# Patient Record
Sex: Female | Born: 1970
Health system: Southern US, Community
[De-identification: ages and names within clinical notes are randomized; demographics above are authoritative.]

## PROBLEM LIST (undated history)

## (undated) DIAGNOSIS — N61 Mastitis without abscess: Secondary | ICD-10-CM

## (undated) DIAGNOSIS — I10 Essential (primary) hypertension: Secondary | ICD-10-CM

## (undated) DIAGNOSIS — E079 Disorder of thyroid, unspecified: Secondary | ICD-10-CM

## (undated) DIAGNOSIS — F99 Mental disorder, not otherwise specified: Secondary | ICD-10-CM

## (undated) DIAGNOSIS — Z309 Encounter for contraceptive management, unspecified: Secondary | ICD-10-CM

## (undated) DIAGNOSIS — N6320 Unspecified lump in the left breast, unspecified quadrant: Principal | ICD-10-CM

## (undated) DIAGNOSIS — R87629 Unspecified abnormal cytological findings in specimens from vagina: Secondary | ICD-10-CM

## (undated) DIAGNOSIS — R112 Nausea with vomiting, unspecified: Secondary | ICD-10-CM

## (undated) DIAGNOSIS — Z9889 Other specified postprocedural states: Secondary | ICD-10-CM

## (undated) HISTORY — DX: Disorder of thyroid, unspecified: E07.9

## (undated) HISTORY — DX: Mastitis without abscess: N61.0

## (undated) HISTORY — DX: Unspecified abnormal cytological findings in specimens from vagina: R87.629

## (undated) HISTORY — DX: Mental disorder, not otherwise specified: F99

## (undated) HISTORY — DX: Unspecified lump in the left breast, unspecified quadrant: N63.20

## (undated) HISTORY — DX: Essential (primary) hypertension: I10

## (undated) HISTORY — PX: CHOLECYSTECTOMY: SHX55

## (undated) HISTORY — DX: Encounter for contraceptive management, unspecified: Z30.9

---

## 2001-06-02 ENCOUNTER — Encounter: Payer: Self-pay | Admitting: Obstetrics and Gynecology

## 2001-06-02 ENCOUNTER — Ambulatory Visit (HOSPITAL_COMMUNITY): Admission: RE | Admit: 2001-06-02 | Discharge: 2001-06-02 | Payer: Self-pay | Admitting: Obstetrics and Gynecology

## 2001-06-19 ENCOUNTER — Ambulatory Visit (HOSPITAL_COMMUNITY): Admission: RE | Admit: 2001-06-19 | Discharge: 2001-06-19 | Payer: Self-pay | Admitting: Obstetrics and Gynecology

## 2001-07-02 ENCOUNTER — Ambulatory Visit (HOSPITAL_COMMUNITY): Admission: RE | Admit: 2001-07-02 | Discharge: 2001-07-02 | Payer: Self-pay | Admitting: Obstetrics and Gynecology

## 2001-07-06 ENCOUNTER — Inpatient Hospital Stay (HOSPITAL_COMMUNITY): Admission: RE | Admit: 2001-07-06 | Discharge: 2001-07-07 | Payer: Self-pay | Admitting: Obstetrics and Gynecology

## 2001-09-29 ENCOUNTER — Other Ambulatory Visit: Admission: RE | Admit: 2001-09-29 | Discharge: 2001-09-29 | Payer: Self-pay | Admitting: Obstetrics and Gynecology

## 2004-10-31 ENCOUNTER — Ambulatory Visit (HOSPITAL_COMMUNITY): Admission: RE | Admit: 2004-10-31 | Discharge: 2004-10-31 | Payer: Self-pay | Admitting: Obstetrics and Gynecology

## 2004-11-07 ENCOUNTER — Ambulatory Visit (HOSPITAL_COMMUNITY): Admission: RE | Admit: 2004-11-07 | Discharge: 2004-11-07 | Payer: Self-pay | Admitting: Obstetrics and Gynecology

## 2006-03-19 ENCOUNTER — Ambulatory Visit (HOSPITAL_COMMUNITY): Admission: RE | Admit: 2006-03-19 | Discharge: 2006-03-19 | Payer: Self-pay | Admitting: Obstetrics and Gynecology

## 2007-04-01 ENCOUNTER — Ambulatory Visit (HOSPITAL_COMMUNITY): Admission: RE | Admit: 2007-04-01 | Discharge: 2007-04-01 | Payer: Self-pay | Admitting: Obstetrics and Gynecology

## 2008-05-24 ENCOUNTER — Other Ambulatory Visit: Admission: RE | Admit: 2008-05-24 | Discharge: 2008-05-24 | Payer: Self-pay | Admitting: Obstetrics and Gynecology

## 2009-06-16 ENCOUNTER — Other Ambulatory Visit: Admission: RE | Admit: 2009-06-16 | Discharge: 2009-06-16 | Payer: Self-pay | Admitting: Obstetrics and Gynecology

## 2010-10-15 ENCOUNTER — Other Ambulatory Visit (HOSPITAL_COMMUNITY)
Admission: RE | Admit: 2010-10-15 | Discharge: 2010-10-15 | Disposition: A | Discharge status: Home / Self Care | Payer: Commercial Indemnity | Source: Ambulatory Visit | Attending: Obstetrics and Gynecology | Admitting: Obstetrics and Gynecology

## 2010-10-15 DIAGNOSIS — Z01419 Encounter for gynecological examination (general) (routine) without abnormal findings: Secondary | ICD-10-CM | POA: Insufficient documentation

## 2010-12-21 NOTE — Procedures (Signed)
[facility]  Patient:    Sandra Cortez, Sandra Cortez Visit Number: [id number] MRN: [id number]          Service Type: OBS Location: 4A A414 01 Attending Physician:  Tilda Burrow Dictated by:   Kari Baars, M.D. Proc. Date: 06/19/01 Admit Date:  06/19/2001 Discharge Date: 06/19/2001                            EKG Interpretations  TIME:  1107  INTERPRETATION:  The rhythm is sinus arrhythmia and there are areas that look like atrial fibrillation but I believe the rhythm is sinus rhythm with sinus arrhythmia.  There are PVCs.  Possible left atrial enlargement is seen.  T wave abnormalities are seen inferiorly which are nonspecific.  IMPRESSION:  Abnormal electrocardiogram. Dictated by:   Kari Baars, M.D. Attending Physician:  Tilda Burrow DD:  06/20/01 TD:  06/20/01 Job: [id number]

## 2010-12-21 NOTE — Op Note (Signed)
[facility]  Patient:    Sandra Cortez, Sandra Cortez Visit Number: [id number] MRN: [id number]          Service Type: OBS Location: 4A A418 01 Attending Physician:  Tilda Burrow Dictated by:   Christin Bach, M.D. Proc. Date: 07/06/01 Admit Date:  07/06/2001                             Operative Report  TIME:  9:50 a.m.  PROCEDURE:  Epidural catheter placement.  OBSTETRICIAN:  Christin Bach, M.D.  DETAILS OF PROCEDURE:  After IV fluid bolus and 10 mg of Nubain did not help with the patients relief, epidural catheter was placed using loss-of-resistance technique on first attempt at L3-4 interspace with easy identification of epidural space, placement of epidural catheter with 5 cc bolus of 1.5% Xylocaine with epinephrine, followed by 5 cc of Marcaine 0.125% solution.  The patient had gradual improved analgesia and at 10:05, was noted to be rim dilation.  The catheter will be continued through labor, with prompt vaginal delivery anticipated. Dictated by:   Christin Bach, M.D. Attending Physician:  Tilda Burrow DD:  07/06/01 TD:  07/06/01 Job: [id number]

## 2010-12-21 NOTE — Op Note (Signed)
[facility]  Patient:    Sandra Cortez, Sandra Cortez Visit Number: [id number] MRN: [id number]          Service Type: OBS Location: 4A A418 01 Attending Physician:  Tilda Burrow Dictated by:   Christin Bach, M.D. Admit Date:  07/06/2001   CC:         Vivia Ewing, D.O.   Operative Report  DELIVERY TIME:  Time, 1145 a.m.  SURGEON: Christin Bach, M.D.  DESCRIPTION OF PROCEDURE: The patient progressed to completely dilated, with excellent analgesic relief from the epidural.  The epidural was stopped so she could have the perception of pressure.  She pushed through a second stage of approximately 45 minutes.  As she began to crown there was significant fetal bradycardia due to head compression, which was treated with oxygen supplementation.  The patient progressed to spontaneous vertex vaginal delivery from a direct OA position of a healthy female infant, with Apgars of 8 and 9, delivered without difficulty.  The right shoulder and arm were delivered first and the rest of the fetal body all delivered over an intact perineum.  The infant weight was ______.  Apgars were 9 and 9 assigned.  The placenta delivered easily, Schultze presentation, after blood gas was obtained.  There were no lacerations.  The placenta delivered easily  EBL 350 cc.  The patient had epidural catheter removed with the tip visualized as intact.  Delivery was attended by the patients sister and husband and mother. Dictated by:   Christin Bach, M.D. Attending Physician:  Tilda Burrow DD:  07/06/01 TD:  07/06/01 Job: [id number]

## 2010-12-21 NOTE — H&P (Signed)
[facility]  Patient:    Sandra Cortez, Sandra Cortez Visit Number: [id number] MRN: [id number]          Service Type: OBS Location: 4A A418 01 Attending Physician:  Tilda Burrow Dictated by:   Christin Bach, M.D. Admit Date:  07/06/2001   CC:         Vivia Ewing, D.O.   History and Physical  ADMITTING DIAGNOSIS:  Pregnancy at 39-1/[redacted] weeks gestation, active-phase labor.  HISTORY OF PRESENT ILLNESS:  This 40 year old female, gravida 2, para 1, Ab0, LMP October 05, 2000, placing menstrual [facility] December 9th, with corresponding ultrasound EDCs of July 19, 2001, then July 11, 2001, is admitted after a pregnancy course followed through our office.  The patient has been seen twice in the past week for labor symptoms, being sent home on Thanksgiving Day for false labor.  She returns this a.m. with regular contraction pattern, cervical dilation to 3 cm upon admission, progressing to 5 cm by 9 a.m., with membrane rupture revealing scanty amounts of amniotic fluid.  Fetal heart rate tracing is reactive.  The patient is admitted for labor management and expected vaginal delivery.  Patient has requested epidural analgesia.  Plan is to breast feed and will use birth control pills after she discontinues breast feeding.  Prenatal labs include blood type A-negative with RhoGAM administered May 19, 2001; hemoglobin 13; hematocrit 37; hepatitis, HIV, GC, Chlamydia, RPR all negative; glucose challenge test normal at 134 mg%.  Patient is admitted with rapid progress to vaginal delivery anticipated.  PAST MEDICAL HISTORY:  Abnormal Pap, 2000, with negative followup. Cholecystectomy, 2001.  ALLERGIES:  None.  SOCIAL HISTORY:  Cigarettes, alcohol and recreational drugs denied.  PHYSICAL EXAMINATION:  VITAL SIGNS:  Height 5 feet 6 inches.  Weight 169 pounds.  Blood pressure 130/80.  ABDOMEN:  Fundal height 38 cm.  PELVIC:  Cervix 5 cm, 100% effaced, 0 station by  my initial exam at 9 a.m.  PLAN:  Epidural analgesia and probable vaginal delivery. Dictated by:   Christin Bach, M.D. Attending Physician:  Tilda Burrow DD:  07/06/01 TD:  07/06/01 Job: [id number]

## 2010-12-21 NOTE — Procedures (Signed)
[facility]  Patient:    BENNETT, VANSCYOC Visit Number: [id number] MRN: [id number]          Service Type: OBS Location: 4A A418 01 Attending Physician:  Tilda Burrow Dictated by:   Kari Baars, M.D. Admit Date:  07/06/2001 Discharge Date: 07/07/2001                            EKG Interpretations  INTERPRETATION:  The rhythm is a sinus rhythm with sinus arrhythmia and PVCs. There is probable left atrial enlargement.  T wave abnormalities are seen inferiorly which are nonspecific.  IMPRESSION:  Abnormal electrocardiogram. Dictated by:   Kari Baars, M.D. Attending Physician:  Tilda Burrow DD:  07/14/01 TD:  07/15/01 Job: [id number]

## 2011-07-11 ENCOUNTER — Other Ambulatory Visit: Payer: Self-pay | Admitting: Adult Health

## 2011-07-11 DIAGNOSIS — N63 Unspecified lump in unspecified breast: Secondary | ICD-10-CM

## 2011-07-31 ENCOUNTER — Other Ambulatory Visit (HOSPITAL_COMMUNITY): Payer: Self-pay | Admitting: Adult Health

## 2011-07-31 ENCOUNTER — Ambulatory Visit (HOSPITAL_COMMUNITY)
Admission: RE | Admit: 2011-07-31 | Discharge: 2011-07-31 | Disposition: A | Discharge status: Home / Self Care | Payer: Commercial Indemnity | Source: Ambulatory Visit | Attending: Adult Health | Admitting: Adult Health

## 2011-07-31 DIAGNOSIS — N63 Unspecified lump in unspecified breast: Secondary | ICD-10-CM | POA: Insufficient documentation

## 2011-07-31 DIAGNOSIS — N6009 Solitary cyst of unspecified breast: Secondary | ICD-10-CM | POA: Insufficient documentation

## 2011-08-01 ENCOUNTER — Encounter: Payer: Self-pay | Admitting: Gastroenterology

## 2011-08-08 ENCOUNTER — Ambulatory Visit: Payer: Commercial Indemnity | Admitting: Urgent Care

## 2011-08-14 ENCOUNTER — Ambulatory Visit: Payer: Commercial Indemnity | Admitting: Gastroenterology

## 2011-08-26 ENCOUNTER — Ambulatory Visit: Payer: Commercial Indemnity | Admitting: Gastroenterology

## 2011-09-10 ENCOUNTER — Ambulatory Visit: Payer: Commercial Indemnity | Admitting: Gastroenterology

## 2011-10-21 ENCOUNTER — Other Ambulatory Visit (HOSPITAL_COMMUNITY)
Admission: RE | Admit: 2011-10-21 | Discharge: 2011-10-21 | Disposition: A | Discharge status: Home / Self Care | Payer: Commercial Indemnity | Source: Ambulatory Visit | Attending: Obstetrics and Gynecology | Admitting: Obstetrics and Gynecology

## 2011-10-21 DIAGNOSIS — Z01419 Encounter for gynecological examination (general) (routine) without abnormal findings: Secondary | ICD-10-CM | POA: Insufficient documentation

## 2012-07-10 ENCOUNTER — Other Ambulatory Visit: Payer: Self-pay | Admitting: Adult Health

## 2012-07-10 DIAGNOSIS — Z09 Encounter for follow-up examination after completed treatment for conditions other than malignant neoplasm: Secondary | ICD-10-CM

## 2012-08-14 ENCOUNTER — Ambulatory Visit (HOSPITAL_COMMUNITY)
Admission: RE | Admit: 2012-08-14 | Discharge: 2012-08-14 | Disposition: A | Discharge status: Home / Self Care | Payer: Commercial Indemnity | Source: Ambulatory Visit | Attending: Adult Health | Admitting: Adult Health

## 2012-08-14 DIAGNOSIS — Z1231 Encounter for screening mammogram for malignant neoplasm of breast: Secondary | ICD-10-CM | POA: Insufficient documentation

## 2012-08-14 DIAGNOSIS — Z09 Encounter for follow-up examination after completed treatment for conditions other than malignant neoplasm: Secondary | ICD-10-CM

## 2012-08-20 ENCOUNTER — Other Ambulatory Visit: Payer: Self-pay | Admitting: Adult Health

## 2012-08-20 DIAGNOSIS — R928 Other abnormal and inconclusive findings on diagnostic imaging of breast: Secondary | ICD-10-CM

## 2012-09-02 ENCOUNTER — Encounter (HOSPITAL_COMMUNITY): Payer: Commercial Indemnity

## 2012-09-23 ENCOUNTER — Ambulatory Visit (HOSPITAL_COMMUNITY)
Admission: RE | Admit: 2012-09-23 | Discharge: 2012-09-23 | Disposition: A | Discharge status: Home / Self Care | Payer: Commercial Indemnity | Source: Ambulatory Visit | Attending: Adult Health | Admitting: Adult Health

## 2012-09-23 ENCOUNTER — Other Ambulatory Visit (HOSPITAL_COMMUNITY): Payer: Self-pay | Admitting: Adult Health

## 2012-09-23 DIAGNOSIS — R928 Other abnormal and inconclusive findings on diagnostic imaging of breast: Secondary | ICD-10-CM | POA: Insufficient documentation

## 2012-09-23 DIAGNOSIS — N6009 Solitary cyst of unspecified breast: Secondary | ICD-10-CM | POA: Insufficient documentation

## 2013-10-11 ENCOUNTER — Other Ambulatory Visit: Payer: Self-pay | Admitting: Obstetrics and Gynecology

## 2013-10-11 DIAGNOSIS — Z1231 Encounter for screening mammogram for malignant neoplasm of breast: Secondary | ICD-10-CM

## 2013-11-02 ENCOUNTER — Ambulatory Visit (HOSPITAL_COMMUNITY)
Admission: RE | Admit: 2013-11-02 | Discharge: 2013-11-02 | Disposition: A | Discharge status: Home / Self Care | Payer: BC Managed Care – PPO | Source: Ambulatory Visit | Attending: Obstetrics and Gynecology | Admitting: Obstetrics and Gynecology

## 2013-11-02 DIAGNOSIS — Z1231 Encounter for screening mammogram for malignant neoplasm of breast: Secondary | ICD-10-CM | POA: Insufficient documentation

## 2013-11-10 ENCOUNTER — Other Ambulatory Visit: Payer: Self-pay | Admitting: Adult Health

## 2013-12-07 ENCOUNTER — Other Ambulatory Visit (HOSPITAL_COMMUNITY)
Admission: RE | Admit: 2013-12-07 | Discharge: 2013-12-07 | Disposition: A | Discharge status: Home / Self Care | Payer: BC Managed Care – PPO | Source: Ambulatory Visit | Attending: Adult Health | Admitting: Adult Health

## 2013-12-07 ENCOUNTER — Encounter: Payer: Self-pay | Admitting: Adult Health

## 2013-12-07 ENCOUNTER — Ambulatory Visit (INDEPENDENT_AMBULATORY_CARE_PROVIDER_SITE_OTHER): Payer: BC Managed Care – PPO | Admitting: Adult Health

## 2013-12-07 VITALS — BP 122/80 | HR 74 | Ht 65.0 in | Wt 145.0 lb

## 2013-12-07 DIAGNOSIS — Z1212 Encounter for screening for malignant neoplasm of rectum: Secondary | ICD-10-CM

## 2013-12-07 DIAGNOSIS — Z01419 Encounter for gynecological examination (general) (routine) without abnormal findings: Secondary | ICD-10-CM

## 2013-12-07 DIAGNOSIS — Z1151 Encounter for screening for human papillomavirus (HPV): Secondary | ICD-10-CM | POA: Insufficient documentation

## 2013-12-07 LAB — COMPREHENSIVE METABOLIC PANEL
ALBUMIN: 4.3 g/dL (ref 3.5–5.2)
ALK PHOS: 50 U/L (ref 39–117)
ALT: 9 U/L (ref 0–35)
AST: 13 U/L (ref 0–37)
BUN: 9 mg/dL (ref 6–23)
CHLORIDE: 105 meq/L (ref 96–112)
CO2: 28 mEq/L (ref 19–32)
Calcium: 9.1 mg/dL (ref 8.4–10.5)
Creat: 0.67 mg/dL (ref 0.50–1.10)
Glucose, Bld: 91 mg/dL (ref 70–99)
POTASSIUM: 4 meq/L (ref 3.5–5.3)
SODIUM: 138 meq/L (ref 135–145)
TOTAL PROTEIN: 6.7 g/dL (ref 6.0–8.3)
Total Bilirubin: 0.6 mg/dL (ref 0.2–1.2)

## 2013-12-07 LAB — HEMOCCULT GUIAC POC 1CARD (OFFICE): FECAL OCCULT BLD: NEGATIVE

## 2013-12-07 LAB — CBC
HCT: 36.8 % (ref 36.0–46.0)
Hemoglobin: 12.5 g/dL (ref 12.0–15.0)
MCH: 28.3 pg (ref 26.0–34.0)
MCHC: 34 g/dL (ref 30.0–36.0)
MCV: 83.4 fL (ref 78.0–100.0)
Platelets: 180 10*3/uL (ref 150–400)
RBC: 4.41 MIL/uL (ref 3.87–5.11)
RDW: 13.9 % (ref 11.5–15.5)
WBC: 4.7 10*3/uL (ref 4.0–10.5)

## 2013-12-07 LAB — LIPID PANEL
Cholesterol: 135 mg/dL (ref 0–200)
HDL: 51 mg/dL (ref >39)
LDL Cholesterol: 71 mg/dL (ref 0–99)
TRIGLYCERIDES: 63 mg/dL (ref <150)
Total CHOL/HDL Ratio: 2.6 Ratio
VLDL: 13 mg/dL (ref 0–40)

## 2013-12-07 LAB — TSH: TSH: 2.5 u[IU]/mL (ref 0.350–4.500)

## 2013-12-07 NOTE — Progress Notes (Signed)
Patient ID: SELETA HOVLAND, female   DOB: [birth year], 43 y.o.   MRN: [id number] History of Present Illness: Sharie is a 43 year old white female in for a pap and physical.No complaints.   Current Medications, Allergies, Past Medical History, Past Surgical History, Family History and Social History were reviewed in [employer] record.     Review of Systems: Patient denies any headaches, blurred vision, shortness of breath, chest pain, abdominal pain, problems with bowel movements, urination, or intercourse. Not having sex,no joint pain or mood swings.Periods are still regular and not having any hot flashes.    Physical Exam:BP 122/80  Pulse 74  Ht 5\' 5"  (1.651 m)  Wt 145 lb (65.772 kg)  BMI 24.13 kg/m2  LMP 11/17/2013 General:  Well developed, well nourished, no acute distress Skin:  Warm and dry Neck:  Midline trachea, normal thyroid Lungs; Clear to auscultation bilaterally Breast:  No dominant palpable mass, retraction, or nipple discharge Cardiovascular: Regular rate and rhythm Abdomen:  Soft, non tender, no hepatosplenomegaly Pelvic:  External genitalia is normal in appearance.  The vagina is normal in appearance. The cervix is bulbous.Pap with HPV performed.  Uterus is felt to be normal size, shape, and contour.  No                adnexal masses or tenderness noted. Rectal: Good sphincter tone, no polyps, or hemorrhoids felt.  Hemoccult negative. Extremities:  No swelling or varicosities noted Psych:  No mood changes,alert and cooperative,seems truly happy   Impression: Yearly gyn exam    Plan: Physical in 1 year Check CBC,CMP,TSH and lipids, will talk when back Mammogram yearly

## 2013-12-07 NOTE — Patient Instructions (Signed)
Physical in  1 year Mammogram yearly Will talk labs when they are back

## 2013-12-08 ENCOUNTER — Telehealth: Payer: Self-pay | Admitting: Adult Health

## 2013-12-08 NOTE — Telephone Encounter (Signed)
Pt aware labs great will mail her a copy

## 2013-12-22 ENCOUNTER — Telehealth: Payer: Self-pay | Admitting: Adult Health

## 2013-12-22 MED ORDER — NORETHIN-ETH ESTRAD-FE BIPHAS 1 MG-10 MCG / 10 MCG PO TABS: 1.0000 | ORAL_TABLET | Freq: Every day | ORAL | Status: DC

## 2013-12-22 NOTE — Telephone Encounter (Signed)
Pt states saw Derrek Monaco, NP on 12/07/2013 for annual and now requesting birth control pills.

## 2013-12-22 NOTE — Telephone Encounter (Signed)
Wants to get on OCs, will rx lo loestrin use condoms

## 2014-04-01 ENCOUNTER — Telehealth: Payer: Self-pay | Admitting: Obstetrics and Gynecology

## 2014-04-01 ENCOUNTER — Ambulatory Visit (INDEPENDENT_AMBULATORY_CARE_PROVIDER_SITE_OTHER): Payer: BC Managed Care – PPO | Admitting: Adult Health

## 2014-04-01 ENCOUNTER — Encounter: Payer: Self-pay | Admitting: Adult Health

## 2014-04-01 VITALS — BP 130/82 | Temp 99.2°F | Ht 65.0 in | Wt 146.0 lb

## 2014-04-01 DIAGNOSIS — N61 Mastitis without abscess: Secondary | ICD-10-CM

## 2014-04-01 HISTORY — DX: Mastitis without abscess: N61.0

## 2014-04-01 MED ORDER — SULFAMETHOXAZOLE-TMP DS 800-160 MG PO TABS
1.0000 | ORAL_TABLET | Freq: Two times a day (BID) | ORAL | Status: DC
Start: 2014-04-01 — End: 2016-12-26

## 2014-04-01 MED ORDER — HYDROCODONE-ACETAMINOPHEN 5-325 MG PO TABS
1.0000 | ORAL_TABLET | Freq: Four times a day (QID) | ORAL | Status: DC | PRN
Start: 2014-04-01 — End: 2016-12-26

## 2014-04-01 NOTE — Patient Instructions (Signed)
Cellulitis Cellulitis is an infection of the skin and the tissue beneath it. The infected area is usually red and tender. Cellulitis occurs most often in the arms and lower legs.  CAUSES  Cellulitis is caused by bacteria that enter the skin through cracks or cuts in the skin. The most common types of bacteria that cause cellulitis are staphylococci and streptococci. SIGNS AND SYMPTOMS   Redness and warmth.  Swelling.  Tenderness or pain.  Fever. DIAGNOSIS  Your health care provider can usually determine what is wrong based on a physical exam. Blood tests may also be done. TREATMENT  Treatment usually involves taking an antibiotic medicine. HOME CARE INSTRUCTIONS   Take your antibiotic medicine as directed by your health care provider. Finish the antibiotic even if you start to feel better.  Keep the infected arm or leg elevated to reduce swelling.  Apply a warm cloth to the affected area up to 4 times per day to relieve pain.  Take medicines only as directed by your health care provider.  Keep all follow-up visits as directed by your health care provider. SEEK MEDICAL CARE IF:   You notice red streaks coming from the infected area.  Your red area gets larger or turns dark in color.  Your bone or joint underneath the infected area becomes painful after the skin has healed.  Your infection returns in the same area or another area.  You notice a swollen bump in the infected area.  You develop new symptoms.  You have a fever. SEEK IMMEDIATE MEDICAL CARE IF:   You feel very sleepy.  You develop vomiting or diarrhea.  You have a general ill feeling (malaise) with muscle aches and pains. MAKE SURE YOU:   Understand these instructions.  Will watch your condition.  Will get help right away if you are not doing well or get worse. Document Released: 05/01/2005 Document Revised: 12/06/2013 Document Reviewed: 10/07/2011 [facility] Patient Information 2015 [place], [place].  This information is not intended to replace advice given to you by your health care provider. Make sure you discuss any questions you have with your health care provider. Take septra ds Use frozen peas on breast 10 minutes on and then off 30 min Follow up in 2 weeks If gets worse call or go To ER

## 2014-04-01 NOTE — Progress Notes (Signed)
Subjective:     Patient ID: Sandra Cortez, female   DOB: January 08, 1971, 43 y.o.   MRN: [id number]  HPI Sandra Cortez is a 43 year old white female in complaining of pain and swelling in left breast,she had pain yesterday and used heating pad last night and got up this am with redness, pain an swelling.  Review of Systems See HPI Reviewed past medical,surgical, social and family history. Reviewed medications and allergies.     Objective:   Physical Exam BP 130/82  Temp(Src) 99.2 F (37.3 C)  Ht 5\' 5"  (1.651 m)  Wt 146 lb (66.225 kg)  BMI 24.30 kg/m2  LMP 03/31/2014  Skin warm and dry,  Breasts:no dominate palpable mass, retraction or nipple discharge on right, on left no retraction or nipple discharge the whole breast is swollen and tender and is red, with heat, Dr Elonda Husky in for co exam.    Assessment:     Cellulitis left breast    Plan:    keep it supported, may want to sleep in recliner Rx septra ds 1 bid x 14 days #28 no refills Use frozen peas for 10 minutes on 30 minutes off prn Follow up in 2 weeks Go to ER or call if its worse, with fever or pain Rx norco 5-325 mg 1 every 6 hours prn pain no refills,can take 2 if 1 does not help Review handout on cellulitis

## 2014-04-04 NOTE — Telephone Encounter (Signed)
Left messages x 3 with no return call.

## 2014-04-15 ENCOUNTER — Ambulatory Visit (INDEPENDENT_AMBULATORY_CARE_PROVIDER_SITE_OTHER): Payer: BC Managed Care – PPO | Admitting: Adult Health

## 2014-04-15 ENCOUNTER — Encounter: Payer: Self-pay | Admitting: Adult Health

## 2014-04-15 VITALS — BP 138/84 | Ht 65.0 in | Wt 144.0 lb

## 2014-04-15 DIAGNOSIS — N6320 Unspecified lump in the left breast, unspecified quadrant: Secondary | ICD-10-CM | POA: Insufficient documentation

## 2014-04-15 DIAGNOSIS — N63 Unspecified lump in unspecified breast: Secondary | ICD-10-CM

## 2014-04-15 HISTORY — DX: Unspecified lump in the left breast, unspecified quadrant: N63.20

## 2014-04-15 NOTE — Progress Notes (Signed)
Subjective:     Patient ID: Sandra Cortez, female   DOB: 04/23/1971, 43 y.o.   MRN: [id number]  HPI Sandra Cortez is a 43 year old white female back for breast check has had cellulitis, still on antibiotics, is better.  Review of Systems See HPI Reviewed past medical,surgical, social and family history. Reviewed medications and allergies.     Objective:   Physical Exam BP 138/84  Ht 5\' 5"  (1.651 m)  Wt 144 lb (65.318 kg)  BMI 23.96 kg/m2  LMP 03/31/2014    Skin warm and dry,  Breasts:no dominate palpable mass, retraction or nipple discharge on right , on left no retraction or nipple discharge but has 10 x 10 cm  Mass under nipple, resolved cellulitis, no redness or heat or swelling or pain.will get left diagnostic mammogram and [place].  Assessment:     Left breast mass Resolved cellulitis    Plan:     Left diagnostic mammogram and [place] 9/22 at 2 pm at [facility] Follow up in 4 weeks of needed

## 2014-04-15 NOTE — Patient Instructions (Signed)
Follow up in 4 weeks mammogram and [place] at [facility] scheduled 9/22 at  2pm

## 2014-04-26 ENCOUNTER — Ambulatory Visit (HOSPITAL_COMMUNITY)
Admission: RE | Admit: 2014-04-26 | Discharge: 2014-04-26 | Disposition: A | Discharge status: Home / Self Care | Payer: BC Managed Care – PPO | Source: Ambulatory Visit | Attending: Adult Health | Admitting: Adult Health

## 2014-04-26 DIAGNOSIS — N63 Unspecified lump in unspecified breast: Secondary | ICD-10-CM | POA: Insufficient documentation

## 2014-04-26 DIAGNOSIS — N6320 Unspecified lump in the left breast, unspecified quadrant: Secondary | ICD-10-CM

## 2014-05-13 ENCOUNTER — Ambulatory Visit: Payer: BC Managed Care – PPO | Admitting: Adult Health

## 2014-06-06 ENCOUNTER — Encounter: Payer: Self-pay | Admitting: Adult Health

## 2014-10-21 ENCOUNTER — Other Ambulatory Visit: Payer: Self-pay | Admitting: Physician Assistant

## 2014-11-16 ENCOUNTER — Other Ambulatory Visit: Payer: Self-pay | Admitting: Adult Health

## 2014-12-16 ENCOUNTER — Ambulatory Visit (INDEPENDENT_AMBULATORY_CARE_PROVIDER_SITE_OTHER): Payer: BLUE CROSS/BLUE SHIELD | Admitting: Adult Health

## 2014-12-16 ENCOUNTER — Encounter: Payer: Self-pay | Admitting: Adult Health

## 2014-12-16 VITALS — BP 130/90 | HR 64 | Ht 65.0 in | Wt 159.2 lb

## 2014-12-16 DIAGNOSIS — Z9189 Other specified personal risk factors, not elsewhere classified: Secondary | ICD-10-CM

## 2014-12-16 DIAGNOSIS — Z01419 Encounter for gynecological examination (general) (routine) without abnormal findings: Secondary | ICD-10-CM | POA: Diagnosis not present

## 2014-12-16 DIAGNOSIS — Z1212 Encounter for screening for malignant neoplasm of rectum: Secondary | ICD-10-CM

## 2014-12-16 DIAGNOSIS — Z309 Encounter for contraceptive management, unspecified: Secondary | ICD-10-CM

## 2014-12-16 DIAGNOSIS — Z3041 Encounter for surveillance of contraceptive pills: Secondary | ICD-10-CM

## 2014-12-16 HISTORY — DX: Encounter for contraceptive management, unspecified: Z30.9

## 2014-12-16 LAB — HEMOCCULT GUIAC POC 1CARD (OFFICE): FECAL OCCULT BLD: NEGATIVE

## 2014-12-16 MED ORDER — NORETHIN-ETH ESTRAD-FE BIPHAS 1 MG-10 MCG / 10 MCG PO TABS: 1.0000 | ORAL_TABLET | Freq: Every day | ORAL | Status: DC

## 2014-12-16 NOTE — Patient Instructions (Signed)
Physical in 1 year Mammogram yearly  

## 2014-12-16 NOTE — Progress Notes (Signed)
Patient ID: Sandra Cortez, female   DOB: 27-Aug-1970, 44 y.o.   MRN: [id number] History of Present Illness: Sandra Cortez is a 44 year old white female, recently married(08/25/14), in for well woman gyn exam.She had pap 12/07/13 that was normal with negative HPV.   Current Medications, Allergies, Past Medical History, Past Surgical History, Family History and Social History were reviewed in [employer] record.     Review of Systems: Patient denies any headaches, hearing loss, fatigue, blurred vision, shortness of breath, chest pain, abdominal pain, problems with bowel movements, urination, or intercourse. No joint pain or mood swings.Happpy with OCs no period, has some pain and swelling in fingers and hands, she uses computer all day, discussed could be CTS, had negative Phalen's test and negative Tinel sign, but try cock up splits and take false nails off to see if helps.Was started on BP meds this year.    Physical Exam:BP 130/90 mmHg  Pulse 64  Ht 5\' 5"  (1.651 m)  Wt 159 lb 4 oz (72.235 kg)  BMI 26.50 kg/m2 General:  Well developed, well nourished, no acute distress Skin:  Warm and dry Neck:  Midline trachea, normal thyroid, good ROM, no lymphadenopathy Lungs; Clear to auscultation bilaterally Breast:  No dominant palpable mass, retraction, or nipple discharge Cardiovascular: Regular rate and rhythm Abdomen:  Soft, non tender, no hepatosplenomegaly Pelvic:  External genitalia is normal in appearance, no lesions.  The vagina is normal in appearance. Urethra has no lesions or masses. The cervix is bulbous.  Uterus is felt to be normal size, shape, and contour.  No adnexal masses or tenderness noted.Bladder is non tender, no masses felt. Rectal: Good sphincter tone, no polyps, or hemorrhoids felt.  Hemoccult negative. Extremities/musculoskeletal:  No swelling or varicosities noted, no clubbing or cyanosis, negative Phalen's and Tinel today Psych:  No mood changes, alert and  cooperative,seems happy   Impression: Well woman gyn exam no pap Contraceptive management    Plan: Refilled lo loestrin x 1 year Physical in 1 year Mammogram yearly

## 2014-12-24 IMAGING — MG MM DIGITAL SCREENING BILAT
4 series · 4 of 4 positions shown · non-contrast
Comparison: 07/31/2011, 04/01/2007

CLINICAL DATA: Screening.

BILATERAL DIGITAL SCREENING MAMMOGRAM WITH CAD

[L CC]
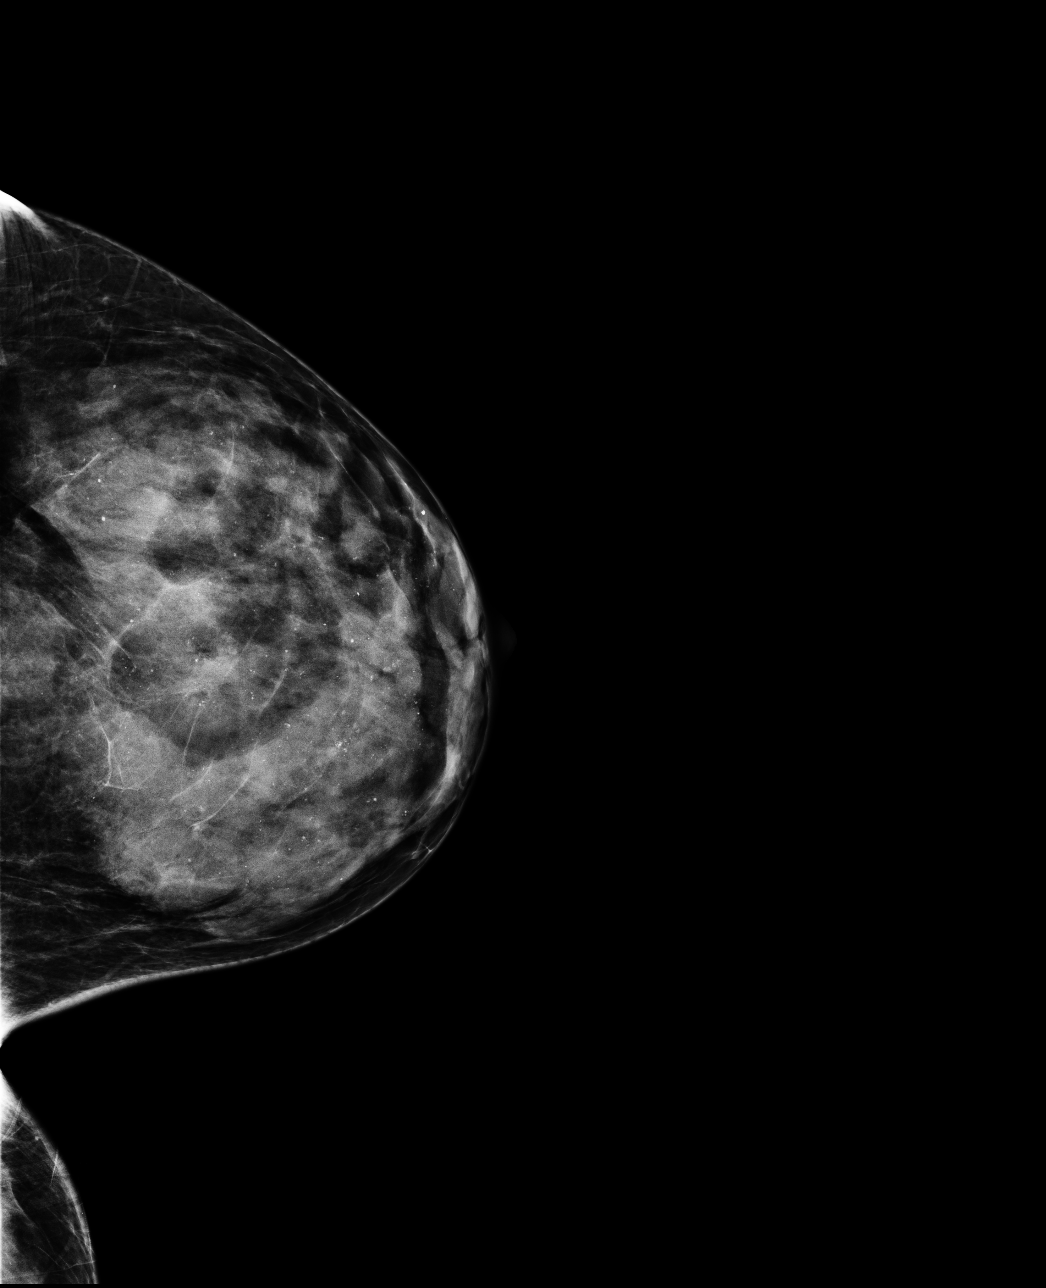

[L MLO]
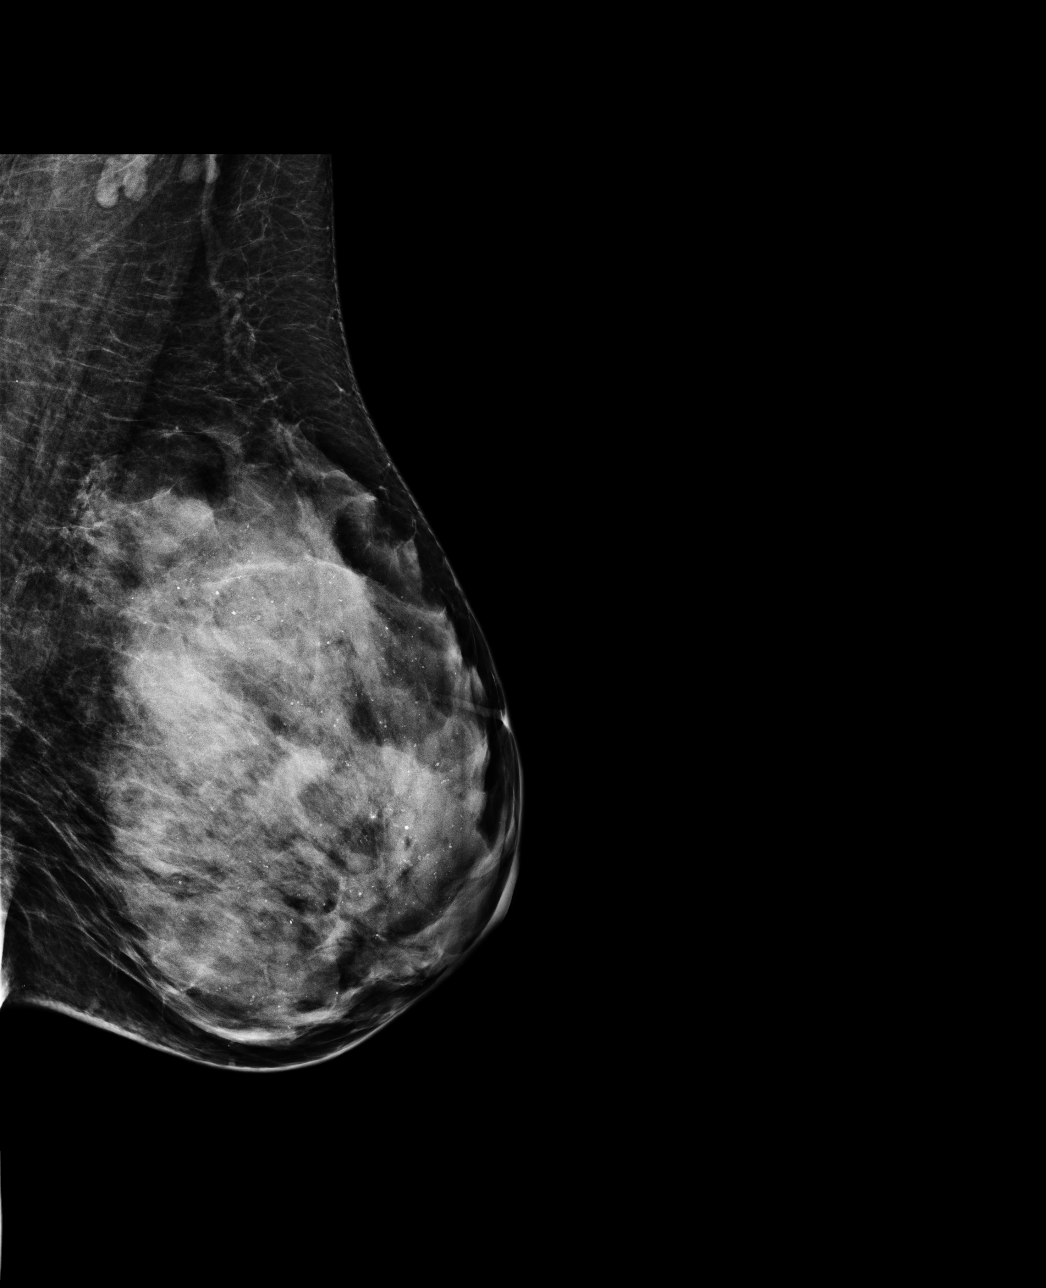

[R CC]
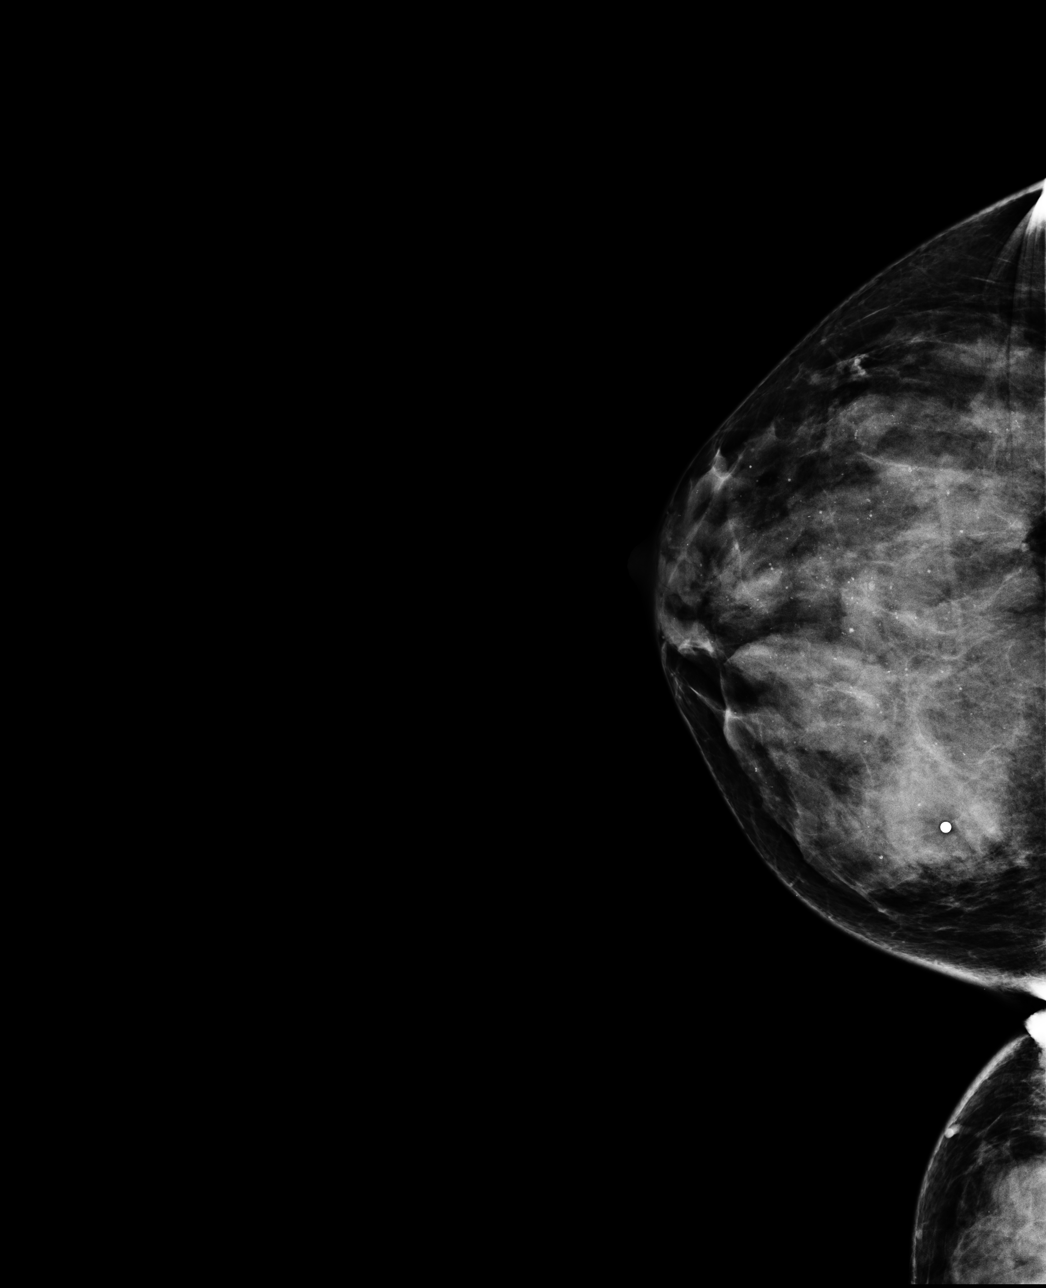

[R MLO]
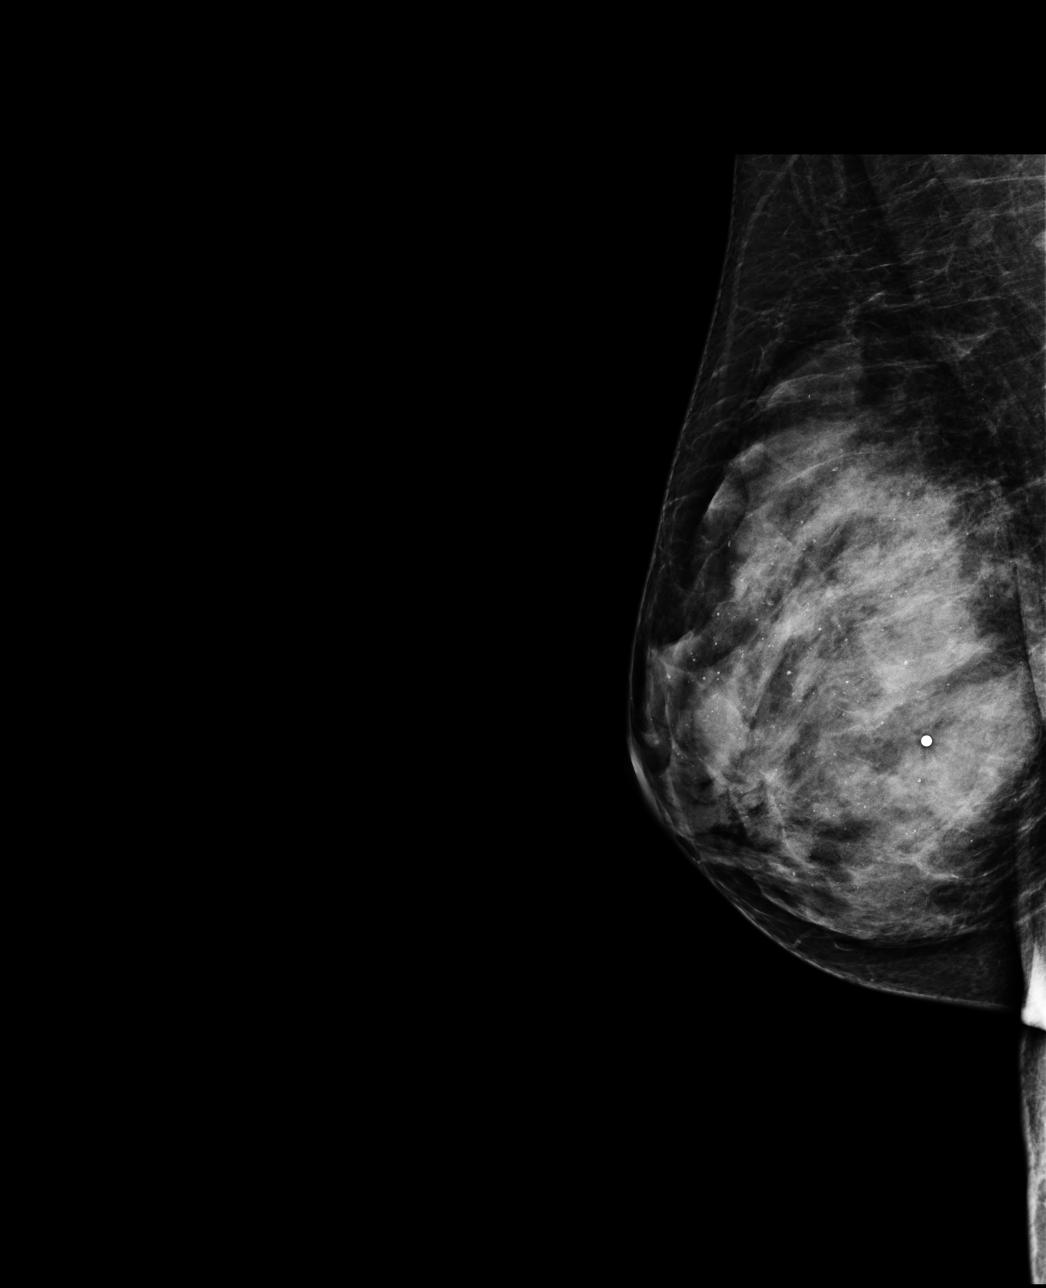

[4 of 4 positions shown; findings below may reference images not displayed]

FINDINGS: ACR Breast Density Category 3: The breast tissue is heterogeneously
dense.

No findings suspicious for malignancy.

The patient complains of a palpable mass in the right upper inner
quadrant.  Additional evaluation with compression views and right
breast ultrasound is suggested.

Images were processed with CAD.
IMPRESSION: No mammographic evidence of malignancy.  However, follow-up is
suggested for evaluation of the patient's complaint of a palpable
mass in the right upper inner quadrant

The patient will be contacted regarding the findings, and
additional imaging will be scheduled.

BI-RADS CATEGORY 0:  Incomplete.  Need additional imaging
evaluation and/or prior mammograms for comparison.

## 2015-03-09 ENCOUNTER — Telehealth: Payer: Self-pay | Admitting: Adult Health

## 2015-03-09 NOTE — Telephone Encounter (Signed)
Spoke with pt. Pt has been on Lo Loestrin since 12/2013. She stopped having periods 03/2014. She started bleeding 2 weeks ago and is still bleeding. Not heavy, but everyday. I advised she needs to make an appt. If bleeding stops, can cancel appt. Pt voiced understanding. Call transferred to front desk. [place]

## 2015-03-14 ENCOUNTER — Ambulatory Visit: Payer: BLUE CROSS/BLUE SHIELD | Admitting: Adult Health

## 2015-12-06 ENCOUNTER — Other Ambulatory Visit: Payer: Self-pay | Admitting: Adult Health

## 2015-12-28 ENCOUNTER — Other Ambulatory Visit: Payer: Self-pay | Admitting: Physician Assistant

## 2016-03-13 IMAGING — MG MM DIGITAL SCREENING
4 series · 4 of 4 positions shown · non-contrast
Comparison: Previous exam(s).

CLINICAL DATA: Screening.

EXAM:
DIGITAL SCREENING BILATERAL MAMMOGRAM WITH CAD

[L CC]
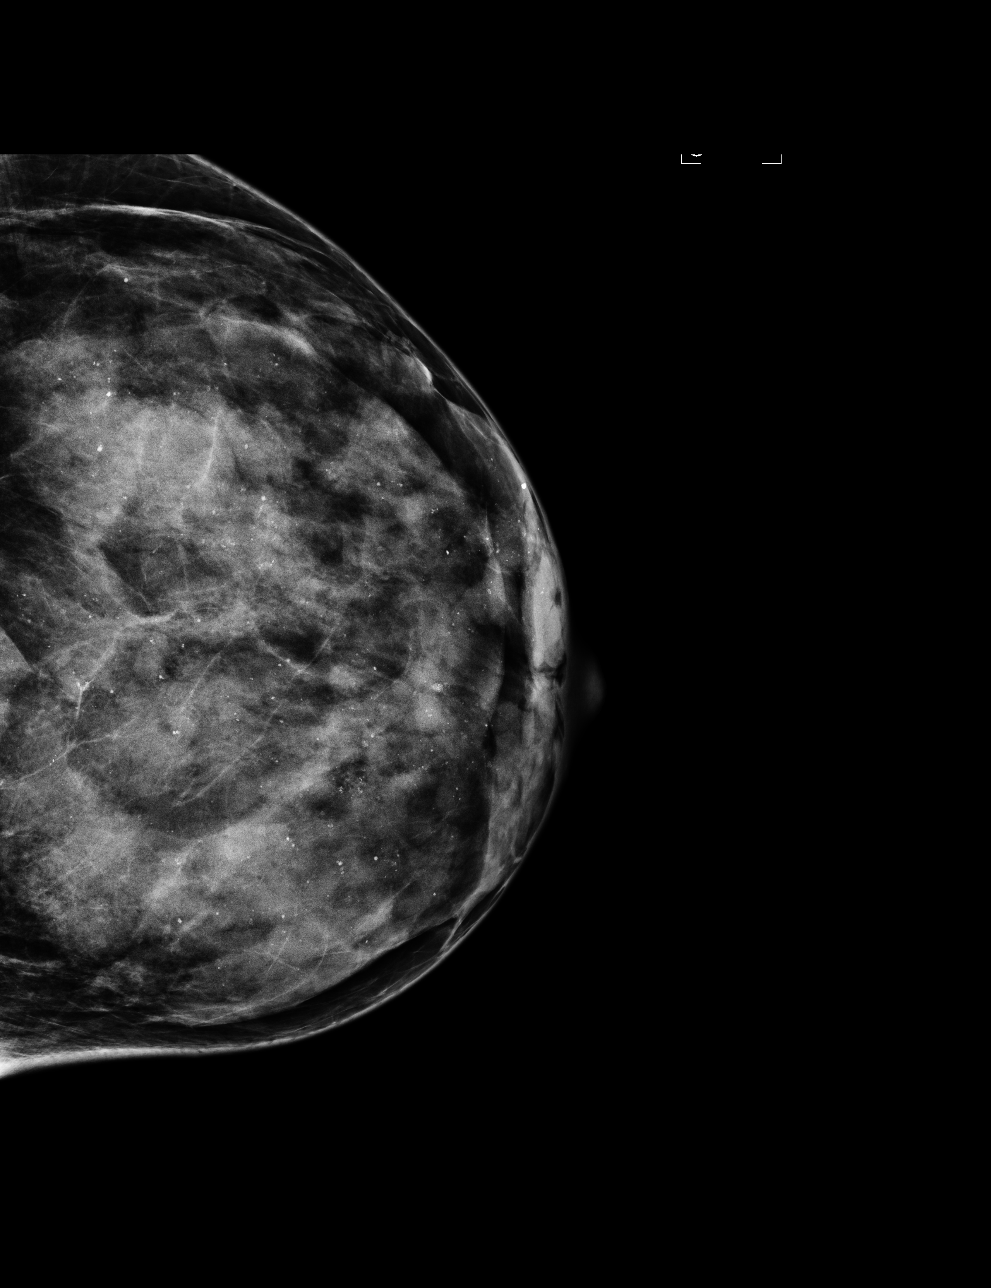

[L MLO]
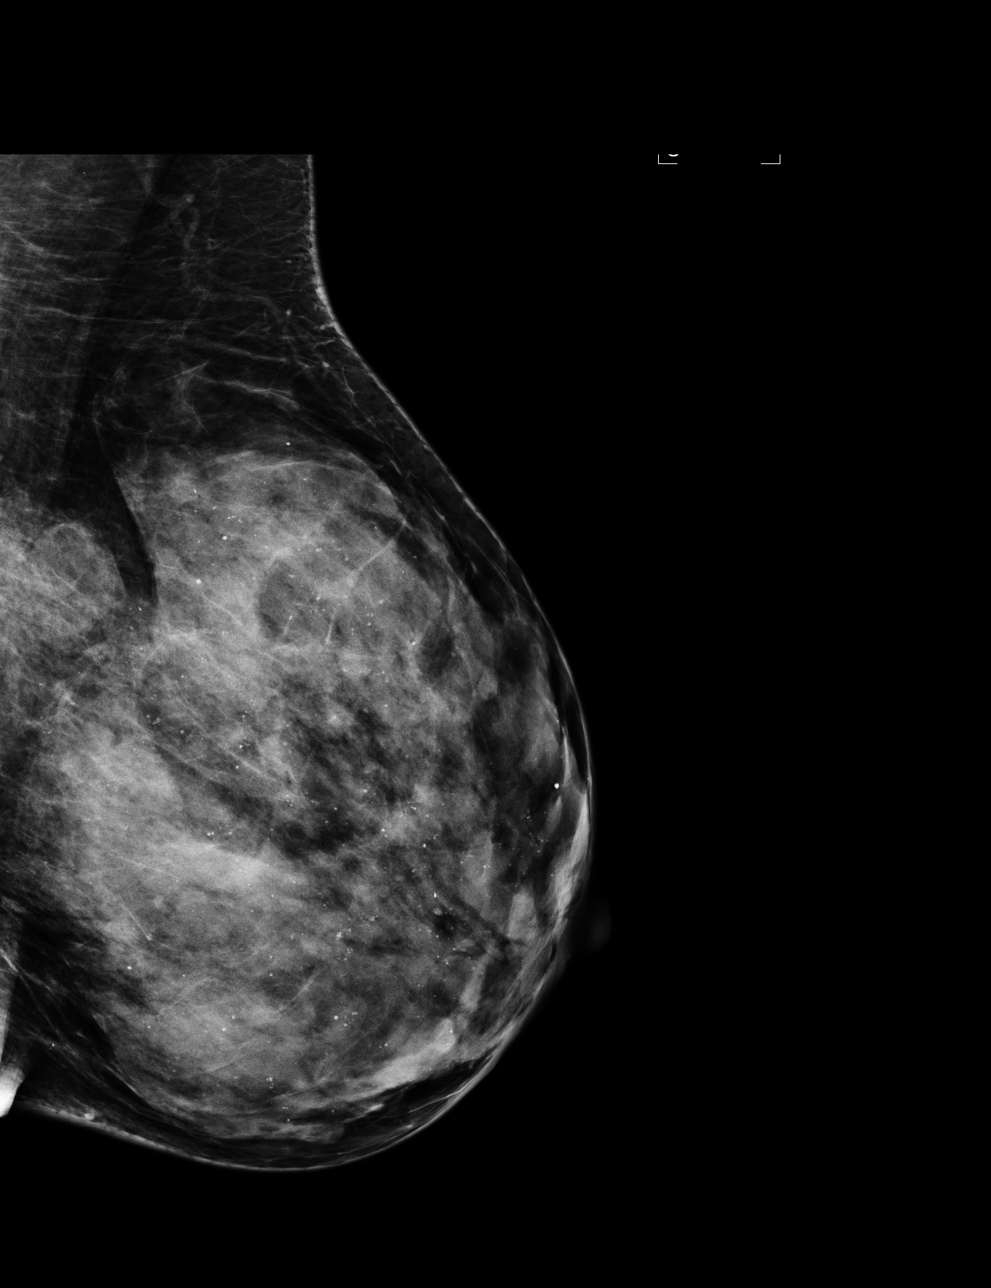

[R CC]
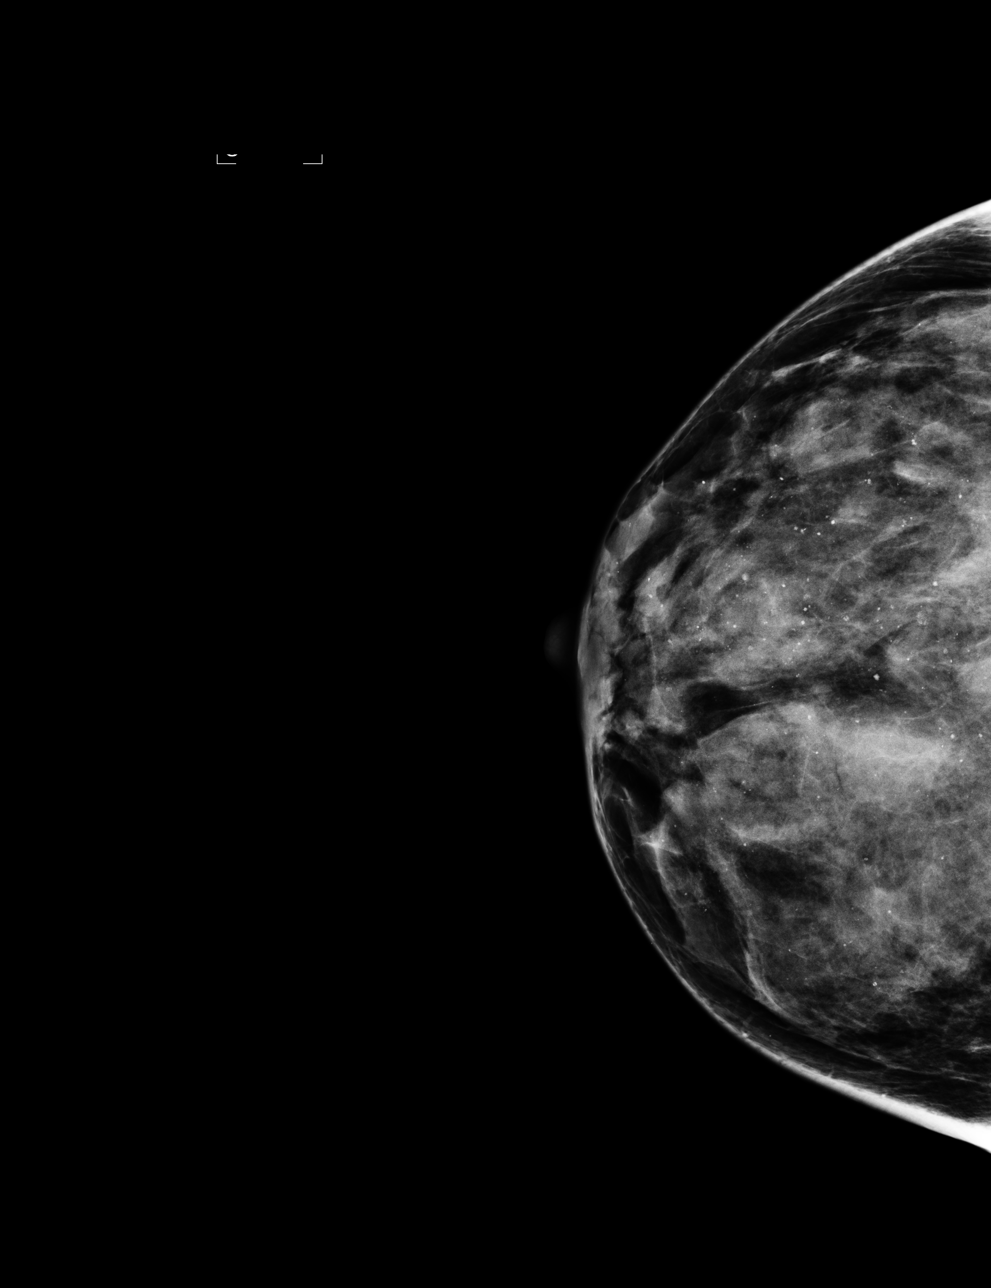

[R MLO]
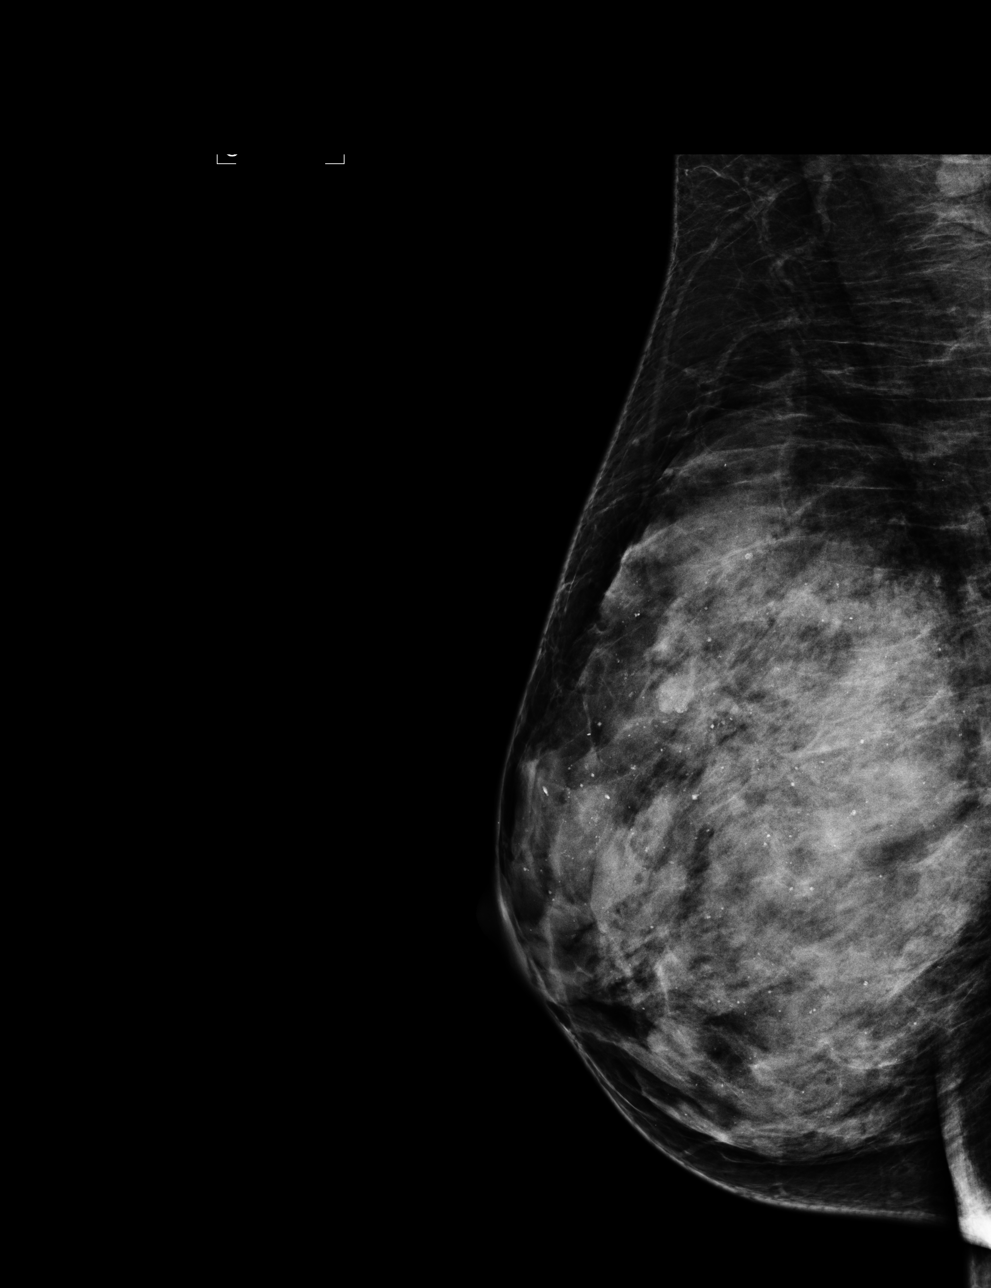

[4 of 4 positions shown; findings below may reference images not displayed]

ACR Breast Density Category c: The breast tissue is heterogeneously
dense, which may obscure small masses.
FINDINGS: There are no findings suspicious for malignancy. Images were
processed with CAD.
IMPRESSION: No mammographic evidence of malignancy. A result letter of this
screening mammogram will be mailed directly to the patient.

RECOMMENDATION:
Screening mammogram in one year. (Code:YJ-2-FEZ)

BI-RADS CATEGORY  1: Negative.

## 2016-05-09 ENCOUNTER — Other Ambulatory Visit: Payer: Self-pay | Admitting: Obstetrics and Gynecology

## 2016-05-09 DIAGNOSIS — Z1231 Encounter for screening mammogram for malignant neoplasm of breast: Secondary | ICD-10-CM

## 2016-05-20 ENCOUNTER — Ambulatory Visit (HOSPITAL_COMMUNITY)
Admission: RE | Admit: 2016-05-20 | Discharge: 2016-05-20 | Disposition: A | Discharge status: Home / Self Care | Payer: BLUE CROSS/BLUE SHIELD | Source: Ambulatory Visit | Attending: Obstetrics and Gynecology | Admitting: Obstetrics and Gynecology

## 2016-05-20 DIAGNOSIS — R928 Other abnormal and inconclusive findings on diagnostic imaging of breast: Secondary | ICD-10-CM | POA: Diagnosis not present

## 2016-05-20 DIAGNOSIS — Z1231 Encounter for screening mammogram for malignant neoplasm of breast: Secondary | ICD-10-CM | POA: Insufficient documentation

## 2016-05-23 ENCOUNTER — Other Ambulatory Visit: Payer: Self-pay | Admitting: Obstetrics and Gynecology

## 2016-05-23 DIAGNOSIS — R928 Other abnormal and inconclusive findings on diagnostic imaging of breast: Secondary | ICD-10-CM

## 2016-05-30 ENCOUNTER — Ambulatory Visit
Admission: RE | Admit: 2016-05-30 | Discharge: 2016-05-30 | Disposition: A | Discharge status: Home / Self Care | Payer: BLUE CROSS/BLUE SHIELD | Source: Ambulatory Visit | Attending: Obstetrics and Gynecology | Admitting: Obstetrics and Gynecology

## 2016-05-30 DIAGNOSIS — R928 Other abnormal and inconclusive findings on diagnostic imaging of breast: Secondary | ICD-10-CM

## 2016-11-12 ENCOUNTER — Other Ambulatory Visit: Payer: Self-pay | Admitting: Adult Health

## 2016-12-10 ENCOUNTER — Encounter: Payer: Self-pay | Admitting: Adult Health

## 2016-12-10 ENCOUNTER — Ambulatory Visit (INDEPENDENT_AMBULATORY_CARE_PROVIDER_SITE_OTHER): Payer: BLUE CROSS/BLUE SHIELD | Admitting: Adult Health

## 2016-12-10 ENCOUNTER — Other Ambulatory Visit (HOSPITAL_COMMUNITY)
Admission: RE | Admit: 2016-12-10 | Discharge: 2016-12-10 | Disposition: A | Discharge status: Home / Self Care | Payer: BLUE CROSS/BLUE SHIELD | Source: Ambulatory Visit | Attending: Adult Health | Admitting: Adult Health

## 2016-12-10 VITALS — BP 140/86 | HR 76 | Ht 64.5 in | Wt 159.0 lb

## 2016-12-10 DIAGNOSIS — Z1211 Encounter for screening for malignant neoplasm of colon: Secondary | ICD-10-CM

## 2016-12-10 DIAGNOSIS — Z1212 Encounter for screening for malignant neoplasm of rectum: Secondary | ICD-10-CM

## 2016-12-10 DIAGNOSIS — Z01419 Encounter for gynecological examination (general) (routine) without abnormal findings: Secondary | ICD-10-CM | POA: Insufficient documentation

## 2016-12-10 DIAGNOSIS — Z3041 Encounter for surveillance of contraceptive pills: Secondary | ICD-10-CM

## 2016-12-10 LAB — HEMOCCULT GUIAC POC 1CARD (OFFICE): Fecal Occult Blood, POC: NEGATIVE

## 2016-12-10 MED ORDER — NORETHIN-ETH ESTRAD-FE BIPHAS 1 MG-10 MCG / 10 MCG PO TABS: 1.0000 | ORAL_TABLET | Freq: Every day | ORAL | 12 refills | Status: DC

## 2016-12-10 MED ORDER — NORETHINDRONE 0.35 MG PO TABS: 1.0000 | ORAL_TABLET | Freq: Every day | ORAL | 11 refills | Status: DC
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
## 2016-12-11 LAB — CYTOLOGY - PAP
Adequacy: ABSENT
Chlamydia: NEGATIVE
Diagnosis: NEGATIVE
HPV (WINDOPATH): NOT DETECTED
NEISSERIA GONORRHEA: NEGATIVE

## 2016-12-26 ENCOUNTER — Encounter: Payer: Self-pay | Admitting: Adult Health

## 2016-12-26 ENCOUNTER — Ambulatory Visit (INDEPENDENT_AMBULATORY_CARE_PROVIDER_SITE_OTHER): Payer: BLUE CROSS/BLUE SHIELD | Admitting: Adult Health

## 2016-12-26 ENCOUNTER — Telehealth: Payer: Self-pay | Admitting: Adult Health

## 2016-12-26 VITALS — BP 110/80 | HR 80 | Ht 64.0 in | Wt 157.0 lb

## 2016-12-26 DIAGNOSIS — N6310 Unspecified lump in the right breast, unspecified quadrant: Secondary | ICD-10-CM

## 2016-12-26 DIAGNOSIS — N61 Mastitis without abscess: Secondary | ICD-10-CM

## 2016-12-26 MED ORDER — SULFAMETHOXAZOLE-TRIMETHOPRIM 800-160 MG PO TABS: 1.0000 | ORAL_TABLET | Freq: Two times a day (BID) | ORAL | 0 refills | Status: DC

## 2016-12-26 NOTE — Telephone Encounter (Signed)
Called pt will work in today at 4 pm

## 2016-12-26 NOTE — Progress Notes (Signed)
Subjective:     Patient ID: Sandra Cortez, female   DOB: November 20, 1970, 46 y.o.   MRN: [id number]  HPI Sandra Cortez is a 46 year old white female, worked in for pain in right breast for 1 day and lump too.   Review of Systems Pain in right breast x 1 day  Breast lump about a week Reviewed past medical,surgical, social and family history. Reviewed medications and allergies.     Objective:   Physical Exam BP 110/80 (BP Location: Left Arm, Patient Position: Sitting, Cuff Size: Small)   Pulse 80   Ht 5\' 4"  (1.626 m)   Wt 157 lb (71.2 kg)   BMI 26.95 kg/m     Skin warm and dry,  Breasts:no dominate palpable mass, retraction or nipple discharge on left, on right, no retraction or nipple discharge has redness and tenderness inner lower quadrant, and firmness measuring about 10 x 8 cm.Dr Sandra Cortez in for  Co exam so he can follow up with her next week in my absence. She had same thing about 3 years ago in this same breast.  Assessment:     1. Mastitis, right, acute   2. Breast mass, right       Plan:         Meds ordered this encounter  Medications  . sulfamethoxazole-trimethoprim (BACTRIM DS,SEPTRA DS) 800-160 MG tablet    Sig: Take 1 tablet by mouth 2 (two) times daily.    Dispense:  28 tablet    Refill:  0    Order Specific Question:   Supervising Provider    Answer:   Tania Ade H [[id number]]  Use ice prn Follow up in 1 week with Dr Sandra Cortez

## 2016-12-26 NOTE — Telephone Encounter (Signed)
Spoke to patient who states for the past 2 days she has had a painful lump in her right breast. It is red, swollen, and feels warm and seems to be getting bigger. She states this has happened before about 3 years ago and you gave her antibiotics. I put her down for next available appt on 01/06/17, but wasn't sure if she should be worked in sooner or if something can be called in for her. Please advise.

## 2017-01-03 ENCOUNTER — Ambulatory Visit (INDEPENDENT_AMBULATORY_CARE_PROVIDER_SITE_OTHER): Payer: BLUE CROSS/BLUE SHIELD | Admitting: Obstetrics & Gynecology

## 2017-01-03 ENCOUNTER — Encounter: Payer: Self-pay | Admitting: Obstetrics & Gynecology

## 2017-01-03 VITALS — BP 110/80 | HR 76 | Wt 155.0 lb

## 2017-01-03 DIAGNOSIS — N61 Mastitis without abscess: Secondary | ICD-10-CM | POA: Diagnosis not present

## 2017-01-03 NOTE — Progress Notes (Signed)
Note from Jennifer's visit from last week was reviewed  Chief Complaint  Patient presents with  . work-in- gyn    RT breast mastitis, not any better    Blood pressure 110/80, pulse 76, weight 155 lb (70.3 kg).  46 y.o. G2P2 No LMP recorded. Patient is not currently having periods (Reason: Oral contraceptives). The current method of family planning is oral progesterone-only contraceptive.  Outpatient Encounter Prescriptions as of 01/03/2017  Medication Sig  . norethindrone (MICRONOR,CAMILA,ERRIN) 0.35 MG tablet Take 1 tablet (0.35 mg total) by mouth daily.  Marland Kitchen sulfamethoxazole-trimethoprim (BACTRIM DS,SEPTRA DS) 800-160 MG tablet Take 1 tablet by mouth 2 (two) times daily.   No facility-administered encounter medications on file as of 01/03/2017.     Subjective Pt is on bactrim ds for spontaneous right breast mastitis This is her second episode from 3 years ago, cleared up on antibiotics We placed her on bactrim ds and she has about 1 week left of meds She says it is better but still with pain Not as warm or red Pain level was 10 last week, now a pain level 5, episodically  Objective Right breast less red not hot, it is sort of consolidating into a wedge say from 12 to 3 occlok telescoping to the nipple, definitely not an abscess today but may be heading in that direction No lymph nodes palpable  Pertinent ROS No burning with urination, frequency or urgency No nausea, vomiting or diarrhea Nor fever chills or other constitutional symptoms   Labs or studies none    Impression Diagnoses this Encounter::   ICD-9-CM ICD-10-CM   1. Mastitis, right, acute 611.0 N61.0    recurrent, 1st episode 3 years ago    Established relevant diagnosis(es):   Plan/Recommendations: No orders of the defined types were placed in this encounter.   Labs or Scans Ordered: No orders of the defined types were placed in this encounter.   Management:: Continue bactrim ds has a week  left Recheck in 1 week to make sure does not become walled off into an abscess  Follow up Return in about 1 week (around 01/10/2017) for Follow up, with Dr Elonda Husky.       All questions were answered.  Past Medical History:  Diagnosis Date  . Cellulitis of breast 04/01/2014  . Contraceptive management 12/16/2014  . Left breast mass 04/15/2014   Has 4 x 4 cm mass under nipple area, the cellulitis has resolved, will get mammogram and [place]  . Mental disorder   . Vaginal Pap smear, abnormal     Past Surgical History:  Procedure Laterality Date  . CHOLECYSTECTOMY      OB History    Gravida Para Term Preterm AB Living   2 2       2    SAB TAB Ectopic Multiple Live Births           2      No Known Allergies  Social History   Social History  . Marital status: Divorced    Spouse name: N/A  . Number of children: N/A  . Years of education: N/A   Social History Main Topics  . Smoking status: Current Every Day Smoker    Packs/day: 0.50    Types: Cigarettes    Last attempt to quit: 12/07/1996  . Smokeless tobacco: Never Used  . Alcohol use 0.0 oz/week     Comment: once a month   . Drug use: No  . Sexual activity: Not Currently  Birth control/ protection: Pill   Other Topics Concern  . None   Social History Narrative  . None    Family History  Problem Relation Age of Onset  . Hypertension Mother   . Diabetes Mother   . Obesity Brother   . Hypertension Brother   . Autism Son   . Cancer Maternal Aunt   . Diabetes Maternal Aunt   . Cancer Maternal Uncle   . Diabetes Maternal Uncle   . Diabetes Paternal Uncle   . Cancer Maternal Grandfather   . Leukemia Paternal Grandmother   . Emphysema Paternal Grandfather

## 2017-01-06 ENCOUNTER — Ambulatory Visit: Payer: BLUE CROSS/BLUE SHIELD | Admitting: Women's Health

## 2017-01-10 ENCOUNTER — Ambulatory Visit: Payer: BLUE CROSS/BLUE SHIELD | Admitting: Obstetrics & Gynecology

## 2017-06-24 ENCOUNTER — Other Ambulatory Visit: Payer: Self-pay | Admitting: Physician Assistant

## 2017-09-16 ENCOUNTER — Other Ambulatory Visit: Payer: Self-pay | Admitting: Obstetrics and Gynecology

## 2017-09-16 DIAGNOSIS — Z1231 Encounter for screening mammogram for malignant neoplasm of breast: Secondary | ICD-10-CM

## 2017-10-08 ENCOUNTER — Ambulatory Visit
Admission: RE | Admit: 2017-10-08 | Discharge: 2017-10-08 | Disposition: A | Discharge status: Home / Self Care | Payer: BLUE CROSS/BLUE SHIELD | Source: Ambulatory Visit | Attending: Obstetrics and Gynecology | Admitting: Obstetrics and Gynecology

## 2017-10-08 DIAGNOSIS — Z1231 Encounter for screening mammogram for malignant neoplasm of breast: Secondary | ICD-10-CM | POA: Diagnosis not present

## 2017-10-09 ENCOUNTER — Other Ambulatory Visit: Payer: Self-pay | Admitting: Obstetrics and Gynecology

## 2017-10-09 DIAGNOSIS — R928 Other abnormal and inconclusive findings on diagnostic imaging of breast: Secondary | ICD-10-CM

## 2017-10-10 ENCOUNTER — Telehealth: Payer: Self-pay | Admitting: Obstetrics and Gynecology

## 2017-10-10 NOTE — Telephone Encounter (Signed)
[facility] to fax form.

## 2017-10-13 ENCOUNTER — Ambulatory Visit
Admission: RE | Admit: 2017-10-13 | Discharge: 2017-10-13 | Disposition: A | Discharge status: Home / Self Care | Payer: BLUE CROSS/BLUE SHIELD | Source: Ambulatory Visit | Attending: Obstetrics and Gynecology | Admitting: Obstetrics and Gynecology

## 2017-10-13 ENCOUNTER — Other Ambulatory Visit: Payer: Self-pay | Admitting: Obstetrics and Gynecology

## 2017-10-13 DIAGNOSIS — R921 Mammographic calcification found on diagnostic imaging of breast: Secondary | ICD-10-CM | POA: Diagnosis not present

## 2017-10-13 DIAGNOSIS — R928 Other abnormal and inconclusive findings on diagnostic imaging of breast: Secondary | ICD-10-CM

## 2017-10-17 DIAGNOSIS — R6889 Other general symptoms and signs: Secondary | ICD-10-CM | POA: Diagnosis not present

## 2017-11-10 ENCOUNTER — Other Ambulatory Visit: Payer: Self-pay | Admitting: Adult Health

## 2018-02-03 ENCOUNTER — Other Ambulatory Visit: Payer: Self-pay | Admitting: Adult Health

## 2018-04-17 ENCOUNTER — Ambulatory Visit
Admission: RE | Admit: 2018-04-17 | Discharge: 2018-04-17 | Disposition: A | Discharge status: Home / Self Care | Payer: BLUE CROSS/BLUE SHIELD | Source: Ambulatory Visit | Attending: Obstetrics and Gynecology | Admitting: Obstetrics and Gynecology

## 2018-04-17 DIAGNOSIS — R921 Mammographic calcification found on diagnostic imaging of breast: Secondary | ICD-10-CM | POA: Diagnosis not present

## 2018-05-25 ENCOUNTER — Other Ambulatory Visit: Payer: Self-pay | Admitting: Adult Health

## 2018-12-07 ENCOUNTER — Other Ambulatory Visit: Payer: Self-pay | Admitting: Adult Health

## 2018-12-31 ENCOUNTER — Other Ambulatory Visit: Payer: Self-pay | Admitting: Obstetrics and Gynecology

## 2018-12-31 DIAGNOSIS — Z1231 Encounter for screening mammogram for malignant neoplasm of breast: Secondary | ICD-10-CM

## 2019-02-18 ENCOUNTER — Ambulatory Visit
Admission: RE | Admit: 2019-02-18 | Discharge: 2019-02-18 | Disposition: A | Discharge status: Home / Self Care | Payer: BC Managed Care – PPO | Source: Ambulatory Visit | Attending: Obstetrics and Gynecology | Admitting: Obstetrics and Gynecology

## 2019-02-18 ENCOUNTER — Other Ambulatory Visit: Payer: Self-pay

## 2019-02-18 DIAGNOSIS — Z1231 Encounter for screening mammogram for malignant neoplasm of breast: Secondary | ICD-10-CM

## 2019-03-29 ENCOUNTER — Other Ambulatory Visit: Payer: Self-pay | Admitting: Adult Health

## 2019-04-26 ENCOUNTER — Other Ambulatory Visit: Payer: Self-pay | Admitting: Adult Health

## 2019-04-27 ENCOUNTER — Other Ambulatory Visit: Payer: Self-pay | Admitting: Physician Assistant

## 2019-04-27 DIAGNOSIS — D235 Other benign neoplasm of skin of trunk: Secondary | ICD-10-CM | POA: Diagnosis not present

## 2019-04-27 DIAGNOSIS — L570 Actinic keratosis: Secondary | ICD-10-CM | POA: Diagnosis not present

## 2019-06-07 ENCOUNTER — Other Ambulatory Visit (HOSPITAL_COMMUNITY)
Admission: RE | Admit: 2019-06-07 | Discharge: 2019-06-07 | Disposition: A | Discharge status: Home / Self Care | Payer: BC Managed Care – PPO | Source: Ambulatory Visit | Attending: Adult Health | Admitting: Adult Health

## 2019-06-07 ENCOUNTER — Other Ambulatory Visit: Payer: Self-pay

## 2019-06-07 ENCOUNTER — Ambulatory Visit (INDEPENDENT_AMBULATORY_CARE_PROVIDER_SITE_OTHER): Payer: BC Managed Care – PPO | Admitting: Adult Health

## 2019-06-07 ENCOUNTER — Encounter: Payer: Self-pay | Admitting: Adult Health

## 2019-06-07 VITALS — BP 131/82 | HR 73 | Ht 64.25 in | Wt 163.0 lb

## 2019-06-07 DIAGNOSIS — Z1212 Encounter for screening for malignant neoplasm of rectum: Secondary | ICD-10-CM

## 2019-06-07 DIAGNOSIS — Z01419 Encounter for gynecological examination (general) (routine) without abnormal findings: Secondary | ICD-10-CM

## 2019-06-07 DIAGNOSIS — Z1211 Encounter for screening for malignant neoplasm of colon: Secondary | ICD-10-CM | POA: Diagnosis not present

## 2019-06-07 DIAGNOSIS — Z113 Encounter for screening for infections with a predominantly sexual mode of transmission: Secondary | ICD-10-CM | POA: Diagnosis not present

## 2019-06-07 DIAGNOSIS — Z3009 Encounter for other general counseling and advice on contraception: Secondary | ICD-10-CM | POA: Diagnosis not present

## 2019-06-07 DIAGNOSIS — Z3041 Encounter for surveillance of contraceptive pills: Secondary | ICD-10-CM

## 2019-06-07 LAB — HEMOCCULT GUIAC POC 1CARD (OFFICE): Fecal Occult Blood, POC: NEGATIVE

## 2019-06-07 MED ORDER — SULFAMETHOXAZOLE-TRIMETHOPRIM 800-160 MG PO TABS: 1.0000 | ORAL_TABLET | Freq: Two times a day (BID) | ORAL | 1 refills | Status: DC

## 2019-06-07 MED ORDER — NORETHINDRONE 0.35 MG PO TABS: 1.0000 | ORAL_TABLET | Freq: Every day | ORAL | 12 refills | Status: DC

## 2019-06-07 NOTE — Progress Notes (Signed)
Patient ID: [name], female   DOB: 1970-10-21, 48 y.o.   MRN: [id number] History of Present Illness: Sandra Cortez is a 48 year old white female, divorced, G2P2, in for a well woman gyn exam, she had a normal pap with negative HPV 12/10/16. She is still working for [employer]. PCP is Patton Salles NP.   Current Medications, Allergies, Past Medical History, Past Surgical History, Family History and Social History were reviewed in [employer] record.     Review of Systems: Patient denies any headaches, hearing loss, fatigue, blurred vision, shortness of breath, chest pain, abdominal pain, problems with bowel movements, urination, or intercourse(not currently active). No joint pain or mood swings.    Physical Exam:BP 131/82 (BP Location: Left Arm, Patient Position: Sitting, Cuff Size: Normal)   Pulse 73   Ht 5' 4.25" (1.632 m)   Wt 163 lb (73.9 kg)   LMP 05/21/2019 (Exact Date)   BMI 27.76 kg/m  General:  Well developed, well nourished, no acute distress Skin:  Warm and dry Neck:  Midline trachea, normal thyroid, good ROM, no lymphadenopathy Lungs; Clear to auscultation bilaterally Breast:  No dominant palpable mass, retraction, or nipple discharge on the left, on the right, no retraction or nipple discharge has firm thickness at 11-2 0;clock, was tender few weeks ago, has history of mastitis and had normal mammogram in July Cardiovascular: Regular rate and rhythm Abdomen:  Soft, non tender, no hepatosplenomegaly Pelvic:  External genitalia is normal in appearance, no lesions.  The vagina is normal in appearance. Urethra has no lesions or masses. The cervix is bulbous.  Uterus is felt to be normal size, shape, and contour.  No adnexal masses or tenderness noted.Bladder is non tender, no masses felt. Rectal: Good sphincter tone, no polyps, or hemorrhoids felt.  Hemoccult negative. Extremities/musculoskeletal:  No swelling or varicosities noted, no clubbing or cyanosis Psych:   No mood changes, alert and cooperative,seems happy Fall risk is low.  PHQ 2 score is 0. Examination chaperoned by Sandra Infante LPN.   Impression and Plan: 1. Encounter for well woman exam with routine gynecological exam Pap and physical in 1 year Monitor area in right breast if tender take septra ds, and if persists will get follow up mammogram and [place]  Check CBC,CMP,TSH and lipids  2. Screening for colorectal cancer Colonoscopy at 51  3. Encounter for surveillance of contraceptive pills Meds ordered this encounter  Medications  . norethindrone (NORA-BE) 0.35 MG tablet    Sig: Take 1 tablet (0.35 mg total) by mouth daily.    Dispense:  28 tablet    Refill:  12    Order Specific Question:   Supervising Provider    Answer:   Elonda Husky, LUTHER H [[id number]]  . sulfamethoxazole-trimethoprim (BACTRIM DS) 800-160 MG tablet    Sig: Take 1 tablet by mouth 2 (two) times daily.    Dispense:  28 tablet    Refill:  1    Order Specific Question:   Supervising Provider    Answer:   Elonda Husky, LUTHER H [[id number]]    4. Family planning  5. Screening examination for STD (sexually transmitted disease) CV swab sent Check HIV and RPR

## 2019-06-08 LAB — CBC
Hematocrit: 40 % (ref 34.0–46.6)
Hemoglobin: 13.6 g/dL (ref 11.1–15.9)
MCH: 29.8 pg (ref 26.6–33.0)
MCHC: 34 g/dL (ref 31.5–35.7)
MCV: 88 fL (ref 79–97)
Platelets: 216 10*3/uL (ref 150–450)
RBC: 4.56 x10E6/uL (ref 3.77–5.28)
RDW: 12.1 % (ref 11.7–15.4)
WBC: 4.7 10*3/uL (ref 3.4–10.8)

## 2019-06-08 LAB — COMPREHENSIVE METABOLIC PANEL
ALT: 6 IU/L (ref 0–32)
AST: 11 IU/L (ref 0–40)
Albumin/Globulin Ratio: 1.8 (ref 1.2–2.2)
Albumin: 4.4 g/dL (ref 3.8–4.8)
Alkaline Phosphatase: 80 IU/L (ref 39–117)
BUN/Creatinine Ratio: 15 (ref 9–23)
BUN: 10 mg/dL (ref 6–24)
Bilirubin Total: 0.6 mg/dL (ref 0.0–1.2)
CO2: 23 mmol/L (ref 20–29)
Calcium: 8.8 mg/dL (ref 8.7–10.2)
Chloride: 105 mmol/L (ref 96–106)
Creatinine, Ser: 0.68 mg/dL (ref 0.57–1.00)
GFR calc Af Amer: 120 mL/min/{1.73_m2} (ref >59)
GFR calc non Af Amer: 105 mL/min/{1.73_m2} (ref >59)
Globulin, Total: 2.5 g/dL (ref 1.5–4.5)
Glucose: 94 mg/dL (ref 65–99)
Potassium: 4.2 mmol/L (ref 3.5–5.2)
Sodium: 141 mmol/L (ref 134–144)
Total Protein: 6.9 g/dL (ref 6.0–8.5)

## 2019-06-08 LAB — CERVICOVAGINAL ANCILLARY ONLY
Chlamydia: NEGATIVE
Comment: NEGATIVE
Comment: NORMAL
Neisseria Gonorrhea: NEGATIVE

## 2019-06-08 LAB — LIPID PANEL
Chol/HDL Ratio: 2.9 ratio (ref 0.0–4.4)
Cholesterol, Total: 140 mg/dL (ref 100–199)
HDL: 49 mg/dL (ref >39)
LDL Chol Calc (NIH): 80 mg/dL (ref 0–99)
Triglycerides: 52 mg/dL (ref 0–149)
VLDL Cholesterol Cal: 11 mg/dL (ref 5–40)

## 2019-06-08 LAB — TSH: TSH: 4.02 u[IU]/mL (ref 0.450–4.500)

## 2019-06-08 LAB — RPR: RPR Ser Ql: NONREACTIVE

## 2019-06-08 LAB — HIV ANTIBODY (ROUTINE TESTING W REFLEX): HIV Screen 4th Generation wRfx: NONREACTIVE

## 2019-07-13 ENCOUNTER — Telehealth: Payer: Self-pay | Admitting: *Deleted

## 2019-07-13 DIAGNOSIS — N6310 Unspecified lump in the right breast, unspecified quadrant: Secondary | ICD-10-CM

## 2019-07-13 NOTE — Addendum Note (Signed)
Addended by: Derrek Monaco A on: 07/13/2019 03:43 PM   Modules accepted: Orders

## 2019-07-13 NOTE — Telephone Encounter (Signed)
Pt aware that dx right mammogram and [place] scheduled for [facility] 12/22 at 1:40 pm be there at 1:20pm

## 2019-07-13 NOTE — Telephone Encounter (Signed)
Patient called stating was seen for physical 11/2 and at that time place was found in right breast. Derrek Monaco, NP. Patient was prescribed antibiotic and was told if not cleared up would talk about getting mammogram. Patient states place is still there.

## 2019-07-27 ENCOUNTER — Other Ambulatory Visit: Payer: Self-pay

## 2019-07-27 ENCOUNTER — Ambulatory Visit (HOSPITAL_COMMUNITY)
Admission: RE | Admit: 2019-07-27 | Discharge: 2019-07-27 | Disposition: A | Discharge status: Home / Self Care | Payer: BC Managed Care – PPO | Source: Ambulatory Visit | Attending: Adult Health | Admitting: Adult Health

## 2019-07-27 DIAGNOSIS — N6310 Unspecified lump in the right breast, unspecified quadrant: Secondary | ICD-10-CM | POA: Insufficient documentation

## 2019-07-27 DIAGNOSIS — R922 Inconclusive mammogram: Secondary | ICD-10-CM | POA: Diagnosis not present

## 2019-07-27 DIAGNOSIS — N6011 Diffuse cystic mastopathy of right breast: Secondary | ICD-10-CM | POA: Diagnosis not present

## 2019-07-27 DIAGNOSIS — N6311 Unspecified lump in the right breast, upper outer quadrant: Secondary | ICD-10-CM | POA: Diagnosis not present

## 2019-08-12 DIAGNOSIS — E663 Overweight: Secondary | ICD-10-CM | POA: Diagnosis not present

## 2019-08-12 DIAGNOSIS — G5603 Carpal tunnel syndrome, bilateral upper limbs: Secondary | ICD-10-CM | POA: Diagnosis not present

## 2019-09-02 DIAGNOSIS — G5603 Carpal tunnel syndrome, bilateral upper limbs: Secondary | ICD-10-CM | POA: Diagnosis not present

## 2019-10-13 DIAGNOSIS — H8112 Benign paroxysmal vertigo, left ear: Secondary | ICD-10-CM | POA: Diagnosis not present

## 2019-10-13 DIAGNOSIS — R42 Dizziness and giddiness: Secondary | ICD-10-CM | POA: Diagnosis not present

## 2019-10-13 DIAGNOSIS — Z0110 Encounter for examination of ears and hearing without abnormal findings: Secondary | ICD-10-CM | POA: Diagnosis not present

## 2019-10-20 DIAGNOSIS — H8112 Benign paroxysmal vertigo, left ear: Secondary | ICD-10-CM | POA: Diagnosis not present

## 2019-10-20 DIAGNOSIS — R42 Dizziness and giddiness: Secondary | ICD-10-CM | POA: Diagnosis not present

## 2020-06-08 ENCOUNTER — Other Ambulatory Visit: Payer: Self-pay

## 2020-06-08 ENCOUNTER — Encounter: Payer: Self-pay | Admitting: Advanced Practice Midwife

## 2020-06-08 ENCOUNTER — Other Ambulatory Visit (HOSPITAL_COMMUNITY)
Admission: RE | Admit: 2020-06-08 | Discharge: 2020-06-08 | Disposition: A | Discharge status: Home / Self Care | Payer: BC Managed Care – PPO | Source: Ambulatory Visit | Attending: Adult Health | Admitting: Adult Health

## 2020-06-08 ENCOUNTER — Ambulatory Visit (INDEPENDENT_AMBULATORY_CARE_PROVIDER_SITE_OTHER): Payer: BC Managed Care – PPO | Admitting: Advanced Practice Midwife

## 2020-06-08 VITALS — BP 144/88 | HR 78 | Ht 64.0 in | Wt 164.0 lb

## 2020-06-08 DIAGNOSIS — Z3009 Encounter for other general counseling and advice on contraception: Secondary | ICD-10-CM

## 2020-06-08 DIAGNOSIS — F419 Anxiety disorder, unspecified: Secondary | ICD-10-CM | POA: Diagnosis not present

## 2020-06-08 DIAGNOSIS — Z01419 Encounter for gynecological examination (general) (routine) without abnormal findings: Secondary | ICD-10-CM | POA: Diagnosis not present

## 2020-06-08 DIAGNOSIS — R5383 Other fatigue: Secondary | ICD-10-CM

## 2020-06-08 DIAGNOSIS — Z131 Encounter for screening for diabetes mellitus: Secondary | ICD-10-CM

## 2020-06-08 DIAGNOSIS — Z8719 Personal history of other diseases of the digestive system: Secondary | ICD-10-CM

## 2020-06-08 DIAGNOSIS — Z13220 Encounter for screening for lipoid disorders: Secondary | ICD-10-CM

## 2020-06-08 DIAGNOSIS — N6001 Solitary cyst of right breast: Secondary | ICD-10-CM

## 2020-06-08 MED ORDER — NORETHIN-ETH ESTRAD-FE BIPHAS 1 MG-10 MCG / 10 MCG PO TABS: 1.0000 | ORAL_TABLET | Freq: Every day | ORAL | 4 refills | Status: DC

## 2020-06-08 NOTE — Progress Notes (Signed)
Sandra Cortez 49 y.o.  Vitals:   06/08/20 1539  BP: (!) 144/88  Pulse: 78     Filed Weights   06/08/20 1539  Weight: 164 lb (74.4 kg)    Past Medical History: Past Medical History:  Diagnosis Date  . Cellulitis of breast 04/01/2014  . Contraceptive management 12/16/2014  . Left breast mass 04/15/2014   Has 4 x 4 cm mass under nipple area, the cellulitis has resolved, will get mammogram and [place]  . Mental disorder   . Vaginal Pap smear, abnormal     Past Surgical History: Past Surgical History:  Procedure Laterality Date  . CHOLECYSTECTOMY      Family History: Family History  Problem Relation Age of Onset  . Hypertension Mother   . Diabetes Mother   . Obesity Brother   . Hypertension Brother   . Autism Son   . Cancer Maternal Aunt   . Diabetes Maternal Aunt   . Cancer Maternal Uncle   . Diabetes Maternal Uncle   . Diabetes Paternal Uncle   . Cancer Maternal Grandfather   . Leukemia Paternal Grandmother   . Emphysema Paternal Grandfather   . Breast cancer Paternal Aunt 68    Social History: Social History   Tobacco Use  . Smoking status: Former Smoker    Packs/day: 0.50    Types: Cigarettes    Quit date: 10/07/2017    Years since quitting: 2.6  . Smokeless tobacco: Never Used  Vaping Use  . Vaping Use: Never used  Substance Use Topics  . Alcohol use: Yes    Alcohol/week: 0.0 standard drinks    Comment: once a month   . Drug use: No    Allergies: No Known Allergies   No current outpatient medications on file.  History of Present Illness: Here for pap and physical.  Last pap 2018, normal.  Has noticed that over the last year, her anxiety has gotten much worse.  Took paxil in the past, no treatment in many years.  Also has a lot of stomach issues--gets nervous diarrhea, stomach pain, cramps.  Has had hx of IBS, stomach issues, saw Dr. Buford Dresser. Has right breast cysts (dx w/US 11 months ago). Over the last several months, Periods q 2 weeks, 4-5 days. Stopped  COCs d/t not being sexually active any more.    Review of Systems   Patient denies any headaches, blurred vision, shortness of breath, chest pain, abdominal pain, problems with bowel movements, urination, or intercourse.   Physical Exam: General:  Well developed, well nourished, no acute distress Skin:  Warm and dry Neck:  Midline trachea, normal thyroid Lungs; Clear to auscultation bilaterally Breast:  No dominant palpable mass, retraction, or nipple discharge in left breast.  Right breast has soft, mobile nodules at 12 o'clock and 9 o'clock, non tender, correlating with cysts seen on [place].  Cardiovascular: Regular rate and rhythm Abdomen:  Soft, non tender, no hepatosplenomegaly Pelvic:  External genitalia is normal in appearance.  The vagina is normal in appearance.  The cervix is bulbous.  Uterus is felt to be normal size, shape, and contour.  No adnexal masses or tenderness noted.  Extremities:  No swelling or varicosities noted Psych:  No mood changes.     Impression/Plan:   Normal GYN exam:  If pap normal, repeat in 3 years  Breast cysts, right breast:  Mammogram due after 07/26/20:  Number given to call and schedule  Anxiety:  Strongly encouraged to seek treatment  GI issues:  Plans to contact Dr. Guerry Minors office to get back into care  Metromenorrhagia:  Rx LoLoestrin, start now

## 2020-06-08 NOTE — Patient Instructions (Signed)
Call [phone] for mammogram appointment  Call insurance company to suggest therapist  Call Dr. Guerry Minors office to schedule appointment

## 2020-06-12 ENCOUNTER — Other Ambulatory Visit: Payer: BC Managed Care – PPO | Admitting: Adult Health

## 2020-06-13 LAB — CYTOLOGY - PAP
Chlamydia: NEGATIVE
Comment: NEGATIVE
Comment: NEGATIVE
Comment: NORMAL
Diagnosis: NEGATIVE
High risk HPV: NEGATIVE
Neisseria Gonorrhea: NEGATIVE

## 2020-06-27 DIAGNOSIS — E559 Vitamin D deficiency, unspecified: Secondary | ICD-10-CM | POA: Diagnosis not present

## 2020-06-27 DIAGNOSIS — Z131 Encounter for screening for diabetes mellitus: Secondary | ICD-10-CM | POA: Diagnosis not present

## 2020-06-27 DIAGNOSIS — Z13220 Encounter for screening for lipoid disorders: Secondary | ICD-10-CM | POA: Diagnosis not present

## 2020-06-27 DIAGNOSIS — R5383 Other fatigue: Secondary | ICD-10-CM | POA: Diagnosis not present

## 2020-07-03 ENCOUNTER — Other Ambulatory Visit: Payer: BC Managed Care – PPO | Admitting: Adult Health

## 2020-07-06 ENCOUNTER — Encounter: Payer: Self-pay | Admitting: Advanced Practice Midwife

## 2020-07-06 DIAGNOSIS — E039 Hypothyroidism, unspecified: Secondary | ICD-10-CM | POA: Insufficient documentation

## 2020-07-20 ENCOUNTER — Other Ambulatory Visit: Payer: Self-pay | Admitting: Adult Health

## 2020-07-20 DIAGNOSIS — Z1231 Encounter for screening mammogram for malignant neoplasm of breast: Secondary | ICD-10-CM

## 2020-08-31 ENCOUNTER — Other Ambulatory Visit: Payer: Self-pay

## 2020-08-31 ENCOUNTER — Ambulatory Visit
Admission: RE | Admit: 2020-08-31 | Discharge: 2020-08-31 | Disposition: A | Discharge status: Home / Self Care | Payer: BC Managed Care – PPO | Source: Ambulatory Visit | Attending: Adult Health | Admitting: Adult Health

## 2020-08-31 DIAGNOSIS — Z1231 Encounter for screening mammogram for malignant neoplasm of breast: Secondary | ICD-10-CM

## 2020-11-24 ENCOUNTER — Telehealth: Payer: Self-pay | Admitting: Adult Health

## 2020-11-24 NOTE — Telephone Encounter (Signed)
Returned pt's call for clarification on phone msg. She stated that she has cellulitis flare-ups once in a while, not often. She was prescribed Bactrim which resolved the issue and wants another refill sent in to her pharmacy. Msg forwarded to Dr Nelda Marseille.

## 2020-11-24 NOTE — Telephone Encounter (Signed)
Patient wants to know if the same prescription( can be called in to her pharmacy) she's had in the past for the exact same medical issue she's had ( red hard mass on left breast). Clinical staff will follow up with patient.

## 2020-11-27 NOTE — Telephone Encounter (Signed)
Called Sandra Cortez to inform her that Dr Nelda Marseille recommended that she be seen in the office. Offered to transfer her to the front staff to schedule her. Sandra Cortez stated that she was feeling much better and would call back if she felt she needed to.

## 2021-06-11 ENCOUNTER — Other Ambulatory Visit: Payer: Self-pay | Admitting: Advanced Practice Midwife

## 2021-07-12 ENCOUNTER — Ambulatory Visit: Payer: BC Managed Care – PPO | Admitting: Physician Assistant

## 2021-12-11 ENCOUNTER — Other Ambulatory Visit: Payer: Self-pay | Admitting: Adult Health

## 2021-12-11 DIAGNOSIS — Z1231 Encounter for screening mammogram for malignant neoplasm of breast: Secondary | ICD-10-CM

## 2022-01-08 ENCOUNTER — Ambulatory Visit
Admission: RE | Admit: 2022-01-08 | Discharge: 2022-01-08 | Disposition: A | Discharge status: Home / Self Care | Payer: BC Managed Care – PPO | Source: Ambulatory Visit | Attending: Adult Health | Admitting: Adult Health

## 2022-01-08 DIAGNOSIS — Z1231 Encounter for screening mammogram for malignant neoplasm of breast: Secondary | ICD-10-CM

## 2022-01-23 ENCOUNTER — Encounter: Payer: Self-pay | Admitting: Adult Health

## 2022-01-23 ENCOUNTER — Ambulatory Visit (INDEPENDENT_AMBULATORY_CARE_PROVIDER_SITE_OTHER): Payer: BC Managed Care – PPO | Admitting: Adult Health

## 2022-01-23 VITALS — BP 118/77 | HR 73 | Ht 64.0 in | Wt 167.4 lb

## 2022-01-23 DIAGNOSIS — Z1211 Encounter for screening for malignant neoplasm of colon: Secondary | ICD-10-CM

## 2022-01-23 DIAGNOSIS — Z01419 Encounter for gynecological examination (general) (routine) without abnormal findings: Secondary | ICD-10-CM

## 2022-01-23 DIAGNOSIS — Z3041 Encounter for surveillance of contraceptive pills: Secondary | ICD-10-CM

## 2022-01-23 LAB — HEMOCCULT GUIAC POC 1CARD (OFFICE): Fecal Occult Blood, POC: NEGATIVE

## 2022-01-23 MED ORDER — LO LOESTRIN FE 1 MG-10 MCG / 10 MCG PO TABS: 1.0000 | ORAL_TABLET | Freq: Every day | ORAL | 11 refills | Status: DC

## 2022-01-23 NOTE — Progress Notes (Signed)
Patient ID: ELIN FENLEY, female   DOB: Nov 19, 1970, 51 y.o.   MRN: [id number] History of Present Illness: Ramiya is a 51 year old white female, divorced, G2P2, in for a well woman gyn exam.  Has gained some weight esp, mid section.  Lab Results  Component Value Date   DIAGPAP  06/08/2020    - Negative for intraepithelial lesion or malignancy (NILM)   HPV NOT DETECTED 12/10/2016   Holden Negative 06/08/2020    PCP is Angelina Ok NP  Current Medications, Allergies, Past Medical History, Past Surgical History, Family History and Social History were reviewed in [employer] record.     Review of Systems: Patient denies any headaches, hearing loss, fatigue, blurred vision, shortness of breath, chest pain, abdominal pain, problems with bowel movements, urination, or intercourse. No joint pain or mood swings.  Some weight gain No period with lo Loestrin    Physical Exam:BP 118/77 (BP Location: Right Arm, Patient Position: Sitting, Cuff Size: Normal)   Pulse 73   Ht '5\' 4"'$  (1.626 m)   Wt 167 lb 6.4 oz (75.9 kg)   BMI 28.73 kg/m   General:  Well developed, well nourished, no acute distress Skin:  Warm and dry Neck:  Midline trachea, normal thyroid, good ROM, no lymphadenopathy Lungs; Clear to auscultation bilaterally Breast:  No dominant palpable mass, retraction, or nipple discharge Cardiovascular: Regular rate and rhythm Abdomen:  Soft, non tender, no hepatosplenomegaly Pelvic:  External genitalia is normal in appearance, no lesions.  The vagina is normal in appearance. Urethra has no lesions or masses. The cervix is smooth.  Uterus is felt to be normal size, shape, and contour.  No adnexal masses or tenderness noted.Bladder is non tender, no masses felt. Rectal: Good sphincter tone, no polyps, or hemorrhoids felt.  Hemoccult negative. Extremities/musculoskeletal:  No swelling or varicosities noted, no clubbing or cyanosis Psych:  No mood changes, alert and  cooperative,seems happy AA is 1 Fall risk is low    01/23/2022    8:30 AM 06/08/2020    3:45 PM 06/07/2019    9:06 AM  Depression screen PHQ 2/9  Decreased Interest 0 0 0  Down, Depressed, Hopeless 1 1 0  PHQ - 2 Score 1 1 0  Altered sleeping 1 0   Tired, decreased energy 1 3   Change in appetite 0 2   Feeling bad or failure about yourself  1 1   Trouble concentrating 0 0   Moving slowly or fidgety/restless 0 0   Suicidal thoughts 0 0   PHQ-9 Score 4 7        01/23/2022    8:31 AM 06/08/2020    3:47 PM  GAD 7 : Generalized Anxiety Score  Nervous, Anxious, on Edge 0 3  Control/stop worrying 0 2  Worry too much - different things 0 1  Trouble relaxing 0 0  Restless 0 0  Easily annoyed or irritable 0 1  Afraid - awful might happen 0 0  Total GAD 7 Score 0 7    Upstream - 01/23/22 0831       Pregnancy Intention Screening   Does the patient want to become pregnant in the next year? No    Does the patient's partner want to become pregnant in the next year? No    Would the patient like to discuss contraceptive options today? No      Contraception Wrap Up   Current Method Oral Contraceptive    End Method Oral  Contraceptive    Contraception Counseling Provided No            Examination chaperoned by Celene Squibb LPN    Impression and Plan: 1. Encounter for well woman exam with routine gynecological exam Pap and physical in 1 year Labs with PCP Had normal mammogram 01/08/22  2. Encounter for screening fecal occult blood testing Hemoccult was negative  3. Screening for colorectal cancer Referred to Dr Gala Romney for screening colonoscopy, her brother had colon cancer last year   27. Encounter for surveillance of contraceptive pills Will continue lo Loestrin Meds ordered this encounter  Medications   Norethindrone-Ethinyl Estradiol-Fe Biphas (LO LOESTRIN FE) 1 MG-10 MCG / 10 MCG tablet    Sig: Take 1 tablet by mouth daily.    Dispense:  28 tablet    Refill:  11     Order Specific Question:   Supervising Provider    Answer:   Tania Ade H [[id number]]

## 2022-01-24 ENCOUNTER — Encounter: Payer: Self-pay | Admitting: *Deleted

## 2022-06-11 ENCOUNTER — Telehealth: Payer: Self-pay | Admitting: Internal Medicine

## 2022-06-11 NOTE — Telephone Encounter (Signed)
Patient left a message to say that she had misplaced the colonoscopy questionnaire.  I printed out another copy and mailed to patient

## 2022-06-25 ENCOUNTER — Encounter: Payer: Self-pay | Admitting: *Deleted

## 2022-06-25 NOTE — Patient Instructions (Signed)
  Procedure: colonoscopy  Estimated body mass index is 27.46 kg/m as calculated from the following:   Height as of this encounter: '5\' 5"'$  (1.651 m).   Weight as of this encounter: 165 lb (74.8 kg).   Have you had a colonoscopy before?  Yes, 2018 or 2019  Do you have family history of colon cancer?  Yes,brother  Do you have a family history of polyps? No  Previous colonoscopy with polyps removed? No  Do you have a history colorectal cancer?   No  Are you diabetic?  No  Do you have a prosthetic or mechanical heart valve? No  Do you have a pacemaker/defibrillator?   No  Have you had endocarditis/atrial fibrillation?  No  Do you use supplemental oxygen/CPAP?  No  Have you had joint replacement within the last 12 months?  No  Do you tend to be constipated or have to use laxatives?  No   Do you have history of alcohol use? If yes, how much and how often.  No  Do you have history or are you using drugs? If yes, what do are you  using?  No  Have you ever had a stroke/heart attack?  No  Have you ever had a heart or other vascular stent placed,?No  Do you take weight loss medication? No but have in the past  female patients,: have you had a hysterectomy? No                              are you post menopausal?  No                              do you still have your menstrual cycle? No    Date of last menstrual period? 12/2021  Do you take any blood-thinning medications such as: (Plavix, aspirin, Coumadin, Aggrenox, Brilinta, Xarelto, Eliquis, Pradaxa, Savaysa or Effient)? No  If yes we need the name, milligram, dosage and who is prescribing doctor:  n/a             Current Outpatient Medications  Medication Sig Dispense Refill   hydrochlorothiazide (HYDRODIURIL) 25 MG tablet Take 25 mg by mouth daily.     levothyroxine (SYNTHROID) 50 MCG tablet Take 50 mcg by mouth daily.     Norethindrone-Ethinyl Estradiol-Fe Biphas (LO LOESTRIN FE) 1 MG-10 MCG / 10 MCG tablet Take 1  tablet by mouth daily. 28 tablet 11   potassium chloride SA (KLOR-CON M) 20 MEQ tablet Take 20 mEq by mouth daily.     venlafaxine XR (EFFEXOR-XR) 37.5 MG 24 hr capsule Take 37.5 mg by mouth daily.     No current facility-administered medications for this visit.    No Known Allergies

## 2022-07-21 NOTE — Progress Notes (Signed)
Ok to schedule. ASA 2. Bmet and pregnancy test per protocol.

## 2022-07-22 ENCOUNTER — Encounter: Payer: Self-pay | Admitting: *Deleted

## 2022-07-22 DIAGNOSIS — Z1211 Encounter for screening for malignant neoplasm of colon: Secondary | ICD-10-CM

## 2022-07-22 MED ORDER — PEG 3350-KCL-NA BICARB-NACL 420 G PO SOLR: 4000.0000 mL | Freq: Once | ORAL | 0 refills | Status: AC

## 2022-07-22 NOTE — Progress Notes (Signed)
Pt has been scheduled for 08/27/22 at 9 am with Dr.Rourk. Instructions mailed and prep sent to the pharmacy

## 2022-07-22 NOTE — Progress Notes (Signed)
Checked schedule and pt was on Dr.Carver's schedule. Advised pt was scheduled with Dr.Carver. Pt verbalized understanding.

## 2022-07-25 ENCOUNTER — Encounter: Payer: Self-pay | Admitting: *Deleted

## 2022-08-21 ENCOUNTER — Encounter: Payer: Self-pay | Admitting: *Deleted

## 2022-08-21 ENCOUNTER — Telehealth: Payer: Self-pay | Admitting: *Deleted

## 2022-08-21 NOTE — Telephone Encounter (Signed)
Pt called and wanted to do a different prep other than the trylite. Instructions sent for Miralax prep to pt.

## 2022-08-22 ENCOUNTER — Other Ambulatory Visit (HOSPITAL_COMMUNITY)
Admission: RE | Admit: 2022-08-22 | Discharge: 2022-08-22 | Disposition: A | Discharge status: Home / Self Care | Payer: BC Managed Care – PPO | Source: Ambulatory Visit | Attending: Internal Medicine | Admitting: Internal Medicine

## 2022-08-22 DIAGNOSIS — Z1211 Encounter for screening for malignant neoplasm of colon: Secondary | ICD-10-CM | POA: Insufficient documentation

## 2022-08-22 DIAGNOSIS — Z1212 Encounter for screening for malignant neoplasm of rectum: Secondary | ICD-10-CM | POA: Insufficient documentation

## 2022-08-22 LAB — BASIC METABOLIC PANEL
Anion gap: 8 (ref 5–15)
BUN: 10 mg/dL (ref 6–20)
CO2: 30 mmol/L (ref 22–32)
Calcium: 9.1 mg/dL (ref 8.9–10.3)
Chloride: 98 mmol/L (ref 98–111)
Creatinine, Ser: 0.75 mg/dL (ref 0.44–1.00)
GFR, Estimated: >60 mL/min (ref >60)
Glucose, Bld: 102 mg/dL — ABNORMAL HIGH (ref 70–99)
Potassium: 3.4 mmol/L — ABNORMAL LOW (ref 3.5–5.1)
Sodium: 136 mmol/L (ref 135–145)

## 2022-08-22 LAB — PREGNANCY, URINE: Preg Test, Ur: NEGATIVE

## 2022-08-27 ENCOUNTER — Encounter (HOSPITAL_COMMUNITY): Payer: Self-pay

## 2022-08-27 ENCOUNTER — Ambulatory Visit (HOSPITAL_COMMUNITY): Payer: BC Managed Care – PPO | Admitting: Anesthesiology

## 2022-08-27 ENCOUNTER — Ambulatory Visit (HOSPITAL_COMMUNITY)
Admission: RE | Admit: 2022-08-27 | Discharge: 2022-08-27 | Disposition: A | Discharge status: Home / Self Care | Payer: BC Managed Care – PPO | Attending: Internal Medicine | Admitting: Internal Medicine

## 2022-08-27 ENCOUNTER — Other Ambulatory Visit: Payer: Self-pay

## 2022-08-27 ENCOUNTER — Encounter (HOSPITAL_COMMUNITY)
Admission: RE | Disposition: A | Discharge status: Home / Self Care | Payer: Self-pay | Source: Home / Self Care | Attending: Internal Medicine

## 2022-08-27 DIAGNOSIS — Z87891 Personal history of nicotine dependence: Secondary | ICD-10-CM | POA: Diagnosis not present

## 2022-08-27 DIAGNOSIS — D125 Benign neoplasm of sigmoid colon: Secondary | ICD-10-CM

## 2022-08-27 DIAGNOSIS — Z79899 Other long term (current) drug therapy: Secondary | ICD-10-CM | POA: Diagnosis not present

## 2022-08-27 DIAGNOSIS — Z8249 Family history of ischemic heart disease and other diseases of the circulatory system: Secondary | ICD-10-CM | POA: Diagnosis not present

## 2022-08-27 DIAGNOSIS — Z800 Family history of malignant neoplasm of digestive organs: Secondary | ICD-10-CM | POA: Diagnosis not present

## 2022-08-27 DIAGNOSIS — E039 Hypothyroidism, unspecified: Secondary | ICD-10-CM | POA: Diagnosis not present

## 2022-08-27 DIAGNOSIS — K648 Other hemorrhoids: Secondary | ICD-10-CM | POA: Diagnosis not present

## 2022-08-27 DIAGNOSIS — I10 Essential (primary) hypertension: Secondary | ICD-10-CM | POA: Insufficient documentation

## 2022-08-27 DIAGNOSIS — K635 Polyp of colon: Secondary | ICD-10-CM | POA: Insufficient documentation

## 2022-08-27 DIAGNOSIS — K5730 Diverticulosis of large intestine without perforation or abscess without bleeding: Secondary | ICD-10-CM | POA: Insufficient documentation

## 2022-08-27 DIAGNOSIS — Z1211 Encounter for screening for malignant neoplasm of colon: Secondary | ICD-10-CM | POA: Diagnosis not present

## 2022-08-27 HISTORY — PX: POLYPECTOMY: SHX5525

## 2022-08-27 HISTORY — DX: Other specified postprocedural states: Z98.890

## 2022-08-27 HISTORY — PX: COLONOSCOPY WITH PROPOFOL: SHX5780

## 2022-08-27 HISTORY — DX: Nausea with vomiting, unspecified: R11.2

## 2022-08-27 SURGERY — COLONOSCOPY WITH PROPOFOL
Anesthesia: General

## 2022-08-27 MED ORDER — PROPOFOL 500 MG/50ML IV EMUL
INTRAVENOUS | Status: DC | PRN
  Administered 2022-08-27: 175 ug/kg/min via INTRAVENOUS

## 2022-08-27 MED ORDER — STERILE WATER FOR IRRIGATION IR SOLN
Status: DC | PRN
  Administered 2022-08-27: 120 mL

## 2022-08-27 MED ORDER — PROPOFOL 10 MG/ML IV BOLUS
INTRAVENOUS | Status: DC | PRN
  Administered 2022-08-27: 50 mg via INTRAVENOUS

## 2022-08-27 MED ORDER — LACTATED RINGERS IV SOLN: INTRAVENOUS | Status: DC

## 2022-08-27 NOTE — Anesthesia Postprocedure Evaluation (Signed)
Anesthesia Post Note  Patient: Sandra Cortez  Procedure(s) Performed: COLONOSCOPY WITH PROPOFOL POLYPECTOMY  Patient location during evaluation: Phase II Anesthesia Type: General Level of consciousness: awake and alert and oriented Pain management: pain level controlled Vital Signs Assessment: post-procedure vital signs reviewed and stable Respiratory status: spontaneous breathing, nonlabored ventilation and respiratory function stable Cardiovascular status: blood pressure returned to baseline and stable Postop Assessment: no apparent nausea or vomiting Anesthetic complications: no  No notable events documented.   Last Vitals:  Vitals:   08/27/22 0651 08/27/22 0821  BP: 125/81 (!) 103/54  Pulse: 80 78  Resp: (!) 21 16  Temp: 37 C 36.7 C  SpO2: 98% 100%    Last Pain:  Vitals:   08/27/22 0821  TempSrc: Oral  PainSc: 0-No pain                 [name] C Jodi Kappes

## 2022-08-27 NOTE — Discharge Instructions (Addendum)
  Colonoscopy Discharge Instructions  Read the instructions outlined below and refer to this sheet in the next few weeks. These discharge instructions provide you with general information on caring for yourself after you leave the hospital. Your doctor may also give you specific instructions. While your treatment has been planned according to the most current medical practices available, unavoidable complications occasionally occur.   ACTIVITY You may resume your regular activity, but move at a slower pace for the next 24 hours.  Take frequent rest periods for the next 24 hours.  Walking will help get rid of the air and reduce the bloated feeling in your belly (abdomen).  No driving for 24 hours (because of the medicine (anesthesia) used during the test).   Do not sign any important legal documents or operate any machinery for 24 hours (because of the anesthesia used during the test).  NUTRITION Drink plenty of fluids.  You may resume your normal diet as instructed by your doctor.  Begin with a light meal and progress to your normal diet. Heavy or fried foods are harder to digest and may make you feel sick to your stomach (nauseated).  Avoid alcoholic beverages for 24 hours or as instructed.  MEDICATIONS You may resume your normal medications unless your doctor tells you otherwise.  WHAT YOU CAN EXPECT TODAY Some feelings of bloating in the abdomen.  Passage of more gas than usual.  Spotting of blood in your stool or on the toilet paper.  IF YOU HAD POLYPS REMOVED DURING THE COLONOSCOPY: No aspirin products for 7 days or as instructed.  No alcohol for 7 days or as instructed.  Eat a soft diet for the next 24 hours.  FINDING OUT THE RESULTS OF YOUR TEST Not all test results are available during your visit. If your test results are not back during the visit, make an appointment with your caregiver to find out the results. Do not assume everything is normal if you have not heard from your  caregiver or the medical facility. It is important for you to follow up on all of your test results.  SEEK IMMEDIATE MEDICAL ATTENTION IF: You have more than a spotting of blood in your stool.  Your belly is swollen (abdominal distention).  You are nauseated or vomiting.  You have a temperature over 101.  You have abdominal pain or discomfort that is severe or gets worse throughout the day.   Your colonoscopy revealed 1 polyp(s) which I removed successfully. Await pathology results, my office will contact you. I recommend repeating colonoscopy in 5 years for surveillance purposes.   You also have diverticulosis and internal hemorrhoids. I would recommend increasing fiber in your diet or adding OTC Benefiber/Metamucil. Be sure to drink at least 4 to 6 glasses of water daily. Follow-up with GI as needed.   I hope you have a great rest of your week!  Elon Alas. Abbey Chatters, D.O. Gastroenterology and Hepatology [facility]

## 2022-08-27 NOTE — Transfer of Care (Signed)
Immediate Anesthesia Transfer of Care Note  Patient: Sandra Cortez  Procedure(s) Performed: COLONOSCOPY WITH PROPOFOL POLYPECTOMY  Patient Location: PACU  Anesthesia Type:General  Level of Consciousness: awake  Airway & Oxygen Therapy: Patient Spontanous Breathing  Post-op Assessment: Report given to RN and Post -op Vital signs reviewed and stable  Post vital signs: Reviewed and stable  Last Vitals:  Vitals Value Taken Time  BP    Temp    Pulse    Resp    SpO2      Last Pain:  Vitals:   08/27/22 0757  TempSrc:   PainSc: 0-No pain         Complications: No notable events documented.

## 2022-08-27 NOTE — Anesthesia Preprocedure Evaluation (Addendum)
Anesthesia Evaluation  Patient identified by MRN, date of birth, ID band Patient awake    Reviewed: Allergy & Precautions, H&P , NPO status , Patient's Chart, lab work & pertinent test results  History of Anesthesia Complications (+) PONV and history of anesthetic complications  Airway Mallampati: I  TM Distance: >3 FB Neck ROM: Full    Dental  (+) Dental Advisory Given, Teeth Intact   Pulmonary former smoker   Pulmonary exam normal breath sounds clear to auscultation       Cardiovascular hypertension, Pt. on medications Normal cardiovascular exam Rhythm:Regular Rate:Normal     Neuro/Psych  PSYCHIATRIC DISORDERS      negative neurological ROS     GI/Hepatic negative GI ROS, Neg liver ROS,,,  Endo/Other  Hypothyroidism    Renal/GU negative Renal ROS  negative genitourinary   Musculoskeletal negative musculoskeletal ROS (+)    Abdominal   Peds negative pediatric ROS (+)  Hematology negative hematology ROS (+)   Anesthesia Other Findings   Reproductive/Obstetrics negative OB ROS                              Anesthesia Physical Anesthesia Plan  ASA: 2  Anesthesia Plan: General   Post-op Pain Management: Minimal or no pain anticipated   Induction: Intravenous  PONV Risk Score and Plan: Propofol infusion  Airway Management Planned: Nasal Cannula and Natural Airway  Additional Equipment:   Intra-op Plan:   Post-operative Plan:   Informed Consent: I have reviewed the patients History and Physical, chart, labs and discussed the procedure including the risks, benefits and alternatives for the proposed anesthesia with the patient or authorized representative who has indicated his/her understanding and acceptance.     Dental advisory given  Plan Discussed with: CRNA and Surgeon  Anesthesia Plan Comments:        Anesthesia Quick Evaluation

## 2022-08-27 NOTE — Op Note (Signed)
[facility] Patient Name: Sandra Cortez Procedure Date: 08/27/2022 7:02 AM MRN: [id number] Date of Birth: October 12, 1970 Attending MD: Elon Alas. Abbey Chatters , [place], [id number] CSN: [id number] Age: 52 Admit Type: Outpatient Procedure:                Colonoscopy Indications:              Screening in patient at increased risk: Colorectal                            cancer in brother before age 75 Providers:                Elon Alas. Abbey Chatters, DO, Lambert Mody, Dereck Leep, Technician Referring MD:             Elon Alas. Abbey Chatters, DO Medicines:                See the Anesthesia note for documentation of the                            administered medications Complications:            No immediate complications. Estimated Blood Loss:     Estimated blood loss was minimal. Procedure:                Pre-Anesthesia Assessment:                           - The anesthesia plan was to use monitored                            anesthesia care (MAC).                           After obtaining informed consent, the colonoscope                            was passed under direct vision. Throughout the                            procedure, the patient's blood pressure, pulse, and                            oxygen saturations were monitored continuously. The                            [id number]-[id number] ([id number]) scope was introduced through                            the anus and advanced to the the cecum, identified                            by appendiceal orifice and ileocecal valve. The  colonoscopy was performed without difficulty. The                            patient tolerated the procedure well. The quality                            of the bowel preparation was evaluated using the                            BBPS [facility] Bowel Preparation Scale) with scores                            of: Right Colon = 3, Transverse Colon = 3 and Left                             Colon = 3 (entire mucosa seen well with no residual                            staining, small fragments of stool or opaque                            liquid). The total BBPS score equals 9. Scope In: 8:01:41 AM Scope Out: 8:15:14 AM Scope Withdrawal Time: 0 hours 10 minutes 16 seconds  Total Procedure Duration: 0 hours 13 minutes 33 seconds  Findings:      The perianal and digital rectal examinations were normal.      Non-bleeding internal hemorrhoids were found during endoscopy.      A few medium-mouthed diverticula were found in the sigmoid colon.      A 4 mm polyp was found in the sigmoid colon. The polyp was sessile. The       polyp was removed with a cold snare. Resection and retrieval were       complete.      The exam was otherwise without abnormality. Impression:               - Non-bleeding internal hemorrhoids.                           - Diverticulosis in the sigmoid colon.                           - One 4 mm polyp in the sigmoid colon, removed with                            a cold snare. Resected and retrieved.                           - The examination was otherwise normal. Moderate Sedation:      Per Anesthesia Care Recommendation:           - Patient has a contact number available for                            emergencies. The signs and symptoms of potential  delayed complications were discussed with the                            patient. Return to normal activities tomorrow.                            Written discharge instructions were provided to the                            patient.                           - Resume previous diet.                           - Continue present medications.                           - Await pathology results.                           - Repeat colonoscopy in 5 years for high risk                            screening purposes/family history of colon cancer                            in  brother.                           - Return to GI clinic PRN. Procedure Code(s):        --- Professional ---                           [phone], Colonoscopy, flexible; with removal of                            tumor(s), polyp(s), or other lesion(s) by snare                            technique Diagnosis Code(s):        --- Professional ---                           Z80.0, Family history of malignant neoplasm of                            digestive organs                           K64.8, Other hemorrhoids                           D12.5, Benign neoplasm of sigmoid colon                           K57.30, Diverticulosis of large intestine without  perforation or abscess without bleeding CPT copyright 2022 American Medical Association. All rights reserved. The codes documented in this report are preliminary and upon coder review may  be revised to meet current compliance requirements. Elon Alas. Abbey Chatters, DO [place] Abbey Chatters, DO 08/27/2022 8:18:55 AM This report has been signed electronically. Number of Addenda: 0

## 2022-08-27 NOTE — H&P (Signed)
Primary Care Physician:  Renee Rival, NP Primary Gastroenterologist:  Dr. Abbey Chatters  Pre-Procedure History & Physical: HPI:  Sandra Cortez is a 52 y.o. female is here for a colonoscopy to be performed for high risk colon cancer screening purposes, family history of colon cancer in brother  Past Medical History:  Diagnosis Date   Cellulitis of breast 04/01/2014   Contraceptive management 12/16/2014   High blood pressure    Left breast mass 04/15/2014   Has 4 x 4 cm mass under nipple area, the cellulitis has resolved, will get mammogram and [place]   Mental disorder    PONV (postoperative nausea and vomiting)    Thyroid disease    Vaginal Pap smear, abnormal     Past Surgical History:  Procedure Laterality Date   CHOLECYSTECTOMY      Prior to Admission medications   Medication Sig Start Date End Date Taking? Authorizing Provider  albuterol (VENTOLIN HFA) 108 (90 Base) MCG/ACT inhaler Inhale 2 puffs into the lungs every 4 (four) hours as needed for wheezing or shortness of breath.   Yes [provider]  hydrochlorothiazide (HYDRODIURIL) 25 MG tablet Take 25 mg by mouth daily. 01/05/22  Yes [provider]  levothyroxine (SYNTHROID) 50 MCG tablet Take 50 mcg by mouth daily. 12/28/21  Yes [provider]  losartan (COZAAR) 50 MG tablet Take 50 mg by mouth daily.   Yes [provider]  Norethindrone-Ethinyl Estradiol-Fe Biphas (LO LOESTRIN FE) 1 MG-10 MCG / 10 MCG tablet Take 1 tablet by mouth daily. 01/23/22  Yes Derrek Monaco A, NP  potassium chloride SA (KLOR-CON M) 20 MEQ tablet Take 20 mEq by mouth daily. 06/05/22  Yes [provider]  valACYclovir (VALTREX) 1000 MG tablet Take 500 mg by mouth daily as needed (Cold sores). 12/21/21  Yes [provider]  venlafaxine XR (EFFEXOR-XR) 37.5 MG 24 hr capsule Take 37.5 mg by mouth daily. 12/28/21  Yes [provider]    Allergies as of 07/22/2022   (No Known Allergies)     Family History  Problem Relation Age of Onset   Emphysema Paternal Grandfather    Leukemia Paternal Grandmother    Cancer Maternal Grandfather    Hypertension Mother    Diabetes Mother    Colon cancer Brother    Obesity Brother    Hypertension Brother    Autism Son    Cancer Maternal Aunt    Diabetes Maternal Aunt    Cancer Maternal Uncle    Diabetes Maternal Uncle    Breast cancer Paternal Aunt 88   Diabetes Paternal Uncle     Social History   Socioeconomic History   Marital status: Divorced    Spouse name: Not on file   Number of children: Not on file   Years of education: Not on file   Highest education level: Not on file  Occupational History   Not on file  Tobacco Use   Smoking status: Former    Packs/day: 0.50    Types: Cigarettes    Quit date: 10/07/2017    Years since quitting: 4.8   Smokeless tobacco: Never  Vaping Use   Vaping Use: Never used  Substance and Sexual Activity   Alcohol use: Yes    Alcohol/week: 0.0 standard drinks of alcohol    Comment: once a month    Drug use: No   Sexual activity: Yes    Birth control/protection: None, Pill  Other Topics Concern   Not on file  Social  History Narrative   Not on file   Social Determinants of Health   Financial Resource Strain: Low Risk  (01/23/2022)   Overall Financial Resource Strain (CARDIA)    Difficulty of Paying Living Expenses: Not hard at all  Food Insecurity: No Food Insecurity (01/23/2022)   Hunger Vital Sign    Worried About Running Out of Food in the Last Year: Never true    Ran Out of Food in the Last Year: Never true  Transportation Needs: No Transportation Needs (01/23/2022)   PRAPARE - Hydrologist (Medical): No    Lack of Transportation (Non-Medical): No  Physical Activity: Insufficiently Active (01/23/2022)   Exercise Vital Sign    Days of Exercise per Week: 2 days    Minutes of Exercise per Session: 10 min  Stress: No Stress Concern Present  (01/23/2022)   [place]    Feeling of Stress : Only a little  Social Connections: Moderately Integrated (01/23/2022)   Social Connection and Isolation Panel [NHANES]    Frequency of Communication with Friends and Family: More than three times a week    Frequency of Social Gatherings with Friends and Family: More than three times a week    Attends Religious Services: More than 4 times per year    Active Member of [employer] or Organizations: Yes    Attends Archivist Meetings: Never    Marital Status: Divorced  Human resources officer Violence: Not At Risk (01/23/2022)   Humiliation, Afraid, Rape, and Kick questionnaire    Fear of Current or Ex-Partner: No    Emotionally Abused: No    Physically Abused: No    Sexually Abused: No    Review of Systems: See HPI, otherwise negative ROS  Physical Exam: Vital signs in last 24 hours: Temp:  [98.6 F (37 C)] 98.6 F (37 C) (01/23 0651) Pulse Rate:  [80] 80 (01/23 0651) Resp:  [21] 21 (01/23 0651) BP: (125)/(81) 125/81 (01/23 0651) SpO2:  [98 %] 98 % (01/23 0651) Weight:  [78.5 kg] 78.5 kg (01/23 0651)   General:   Alert,  Well-developed, well-nourished, pleasant and cooperative in NAD Head:  Normocephalic and atraumatic. Eyes:  Sclera clear, no icterus.   Conjunctiva pink. Ears:  Normal auditory acuity. Nose:  No deformity, discharge,  or lesions. Mouth:  No deformity or lesions, dentition normal. Neck:  Supple; no masses or thyromegaly. Lungs:  Clear throughout to auscultation.   No wheezes, crackles, or rhonchi. No acute distress. Heart:  Regular rate and rhythm; no murmurs, clicks, rubs,  or gallops. Abdomen:  Soft, nontender and nondistended. No masses, hepatosplenomegaly or hernias noted. Normal bowel sounds, without guarding, and without rebound.   Msk:  Symmetrical without gross deformities. Normal posture. Extremities:  Without clubbing or edema. Neurologic:   Alert and  oriented x4;  grossly normal neurologically. Skin:  Intact without significant lesions or rashes. Cervical Nodes:  No significant cervical adenopathy. Psych:  Alert and cooperative. Normal mood and affect.  Impression/Plan: Sandra Cortez is here for a colonoscopy to be performed for high risk colon cancer screening purposes, family history of colon cancer in brother  The risks of the procedure including infection, bleed, or perforation as well as benefits, limitations, alternatives and imponderables have been reviewed with the patient. Questions have been answered. All parties agreeable.

## 2022-08-29 LAB — SURGICAL PATHOLOGY

## 2022-09-02 ENCOUNTER — Encounter (HOSPITAL_COMMUNITY): Payer: Self-pay | Admitting: Internal Medicine

## 2022-12-31 ENCOUNTER — Other Ambulatory Visit: Payer: Self-pay | Admitting: Adult Health

## 2023-02-26 ENCOUNTER — Other Ambulatory Visit: Payer: Self-pay | Admitting: Nurse Practitioner

## 2023-02-26 DIAGNOSIS — Z1231 Encounter for screening mammogram for malignant neoplasm of breast: Secondary | ICD-10-CM

## 2023-03-25 ENCOUNTER — Ambulatory Visit: Admission: RE | Admit: 2023-03-25 | Payer: BC Managed Care – PPO | Source: Ambulatory Visit

## 2023-03-25 DIAGNOSIS — Z1231 Encounter for screening mammogram for malignant neoplasm of breast: Secondary | ICD-10-CM

## 2023-08-06 HISTORY — PX: CARPAL TUNNEL RELEASE: SHX101

## 2023-12-22 ENCOUNTER — Other Ambulatory Visit: Payer: Self-pay | Admitting: Adult Health

## 2024-03-25 ENCOUNTER — Other Ambulatory Visit: Payer: Self-pay | Admitting: Adult Health

## 2024-05-19 ENCOUNTER — Other Ambulatory Visit: Payer: Self-pay | Admitting: Nurse Practitioner

## 2024-05-19 ENCOUNTER — Other Ambulatory Visit: Payer: Self-pay | Admitting: Adult Health

## 2024-05-19 DIAGNOSIS — Z1231 Encounter for screening mammogram for malignant neoplasm of breast: Secondary | ICD-10-CM

## 2024-06-16 ENCOUNTER — Other Ambulatory Visit (HOSPITAL_COMMUNITY): Payer: Self-pay | Admitting: Nurse Practitioner

## 2024-06-16 DIAGNOSIS — Z1231 Encounter for screening mammogram for malignant neoplasm of breast: Secondary | ICD-10-CM

## 2024-06-16 DIAGNOSIS — Z136 Encounter for screening for cardiovascular disorders: Secondary | ICD-10-CM

## 2024-06-21 ENCOUNTER — Ambulatory Visit (HOSPITAL_COMMUNITY)
Admission: RE | Admit: 2024-06-21 | Discharge: 2024-06-21 | Disposition: A | Discharge status: Home / Self Care | Payer: Self-pay | Source: Ambulatory Visit | Attending: Nurse Practitioner | Admitting: Nurse Practitioner

## 2024-06-21 DIAGNOSIS — Z136 Encounter for screening for cardiovascular disorders: Secondary | ICD-10-CM | POA: Insufficient documentation

## 2024-07-07 NOTE — Progress Notes (Unsigned)
 Office Visit Note  Patient: Sandra Cortez             Date of Birth: 10/04/1970           MRN: [id number]             PCP: Johnson Morna FALCON, NP Referring: Johnson Morna FALCON, NP Visit Date: 07/16/2024 Occupation: Data Unavailable  Subjective:  No chief complaint on file.   History of Present Illness: Sandra Cortez is a 53 y.o. female ***     Activities of Daily Living:  Patient reports morning stiffness for *** {minute/hour:19697}.   Patient {ACTIONS;DENIES/REPORTS:21021675::Denies} nocturnal pain.  Difficulty dressing/grooming: {ACTIONS;DENIES/REPORTS:21021675::Denies} Difficulty climbing stairs: {ACTIONS;DENIES/REPORTS:21021675::Denies} Difficulty getting out of chair: {ACTIONS;DENIES/REPORTS:21021675::Denies} Difficulty using hands for taps, buttons, cutlery, and/or writing: {ACTIONS;DENIES/REPORTS:21021675::Denies}  No Rheumatology ROS completed.   PMFS History:  Patient Active Problem List   Diagnosis Date Noted   Encounter for screening fecal occult blood testing 01/23/2022   Hypothyroid 07/06/2020   Screening for colorectal cancer 06/07/2019   Encounter for well woman exam with routine gynecological exam 06/07/2019   Family planning 06/07/2019   Screening examination for STD (sexually transmitted disease) 06/07/2019   Contraceptive management 12/16/2014   Left breast mass 04/15/2014   Cellulitis of breast 04/01/2014    Past Medical History:  Diagnosis Date   Cellulitis of breast 04/01/2014   Contraceptive management 12/16/2014   High blood pressure    Left breast mass 04/15/2014   Has 4 x 4 cm mass under nipple area, the cellulitis has resolved, will get mammogram and US    Mental disorder    PONV (postoperative nausea and vomiting)    Thyroid  disease    Vaginal Pap smear, abnormal     Family History  Problem Relation Age of Onset   Emphysema Paternal Grandfather    Leukemia Paternal Grandmother    Cancer Maternal Grandfather     Hypertension Mother    Diabetes Mother    Colon cancer Brother    Obesity Brother    Hypertension Brother    Autism Son    Cancer Maternal Aunt    Diabetes Maternal Aunt    Cancer Maternal Uncle    Diabetes Maternal Uncle    Breast cancer Paternal Aunt 75   Diabetes Paternal Uncle    Past Surgical History:  Procedure Laterality Date   CHOLECYSTECTOMY     COLONOSCOPY WITH PROPOFOL  N/A 08/27/2022   Procedure: COLONOSCOPY WITH PROPOFOL ;  Surgeon: Cindie Carlin POUR, DO;  Location: [facility];  Service: Endoscopy;  Laterality: N/A;  9:00 AM   POLYPECTOMY  08/27/2022   Procedure: POLYPECTOMY;  Surgeon: Cindie Carlin POUR, DO;  Location: [facility];  Service: Endoscopy;;   Social History   Tobacco Use   Smoking status: Former    Current packs/day: 0.00    Types: Cigarettes    Quit date: 10/07/2017    Years since quitting: 6.7   Smokeless tobacco: Never  Vaping Use   Vaping status: Never Used  Substance Use Topics   Alcohol use: Yes    Alcohol/week: 0.0 standard drinks of alcohol    Comment: once a month    Drug use: No   Social History   Social History Narrative   Not on file      There is no immunization history on file for this patient.   Objective: Vital Signs: There were no vitals taken for this visit.   Physical Exam   Musculoskeletal Exam: ***  CDAI Exam: CDAI  Score: -- Patient Global: --; Provider Global: -- Swollen: --; Tender: -- Joint Exam 07/16/2024   No joint exam has been documented for this visit   There is currently no information documented on the homunculus. Go to the Rheumatology activity and complete the homunculus joint exam.  Investigation: No additional findings.  Imaging: CT CARDIAC SCORING (SELF PAY ONLY) Addendum Date: 06/22/2024 ADDENDUM REPORT: 06/22/2024 17:56 EXAM: OVER-READ INTERPRETATION  CT CHEST The following report is an over-read performed by radiologist Dr. Andrea Gasman of [facility] on 06/22/2024.  This over-read does not include interpretation of cardiac or coronary anatomy or pathology. The coronary calcium score interpretation by the cardiologist is attached. COMPARISON:  None. FINDINGS: Vascular: No aortic atherosclerosis. The included aorta is normal in caliber. Mediastinum/nodes: No adenopathy or mass. Unremarkable esophagus. Lungs: No focal airspace disease. Tiny perifissural nodule in the right middle lobe, series 11, image 17, consistent with benign intrapulmonary lymph node. This needs no further imaging follow-up. No pleural fluid. The included airways are patent. Upper abdomen: No acute or unexpected findings. Musculoskeletal: There are no acute or suspicious osseous abnormalities. Thoracic spondylosis. IMPRESSION: No acute or unexpected extracardiac findings. Electronically Signed   By: Andrea Gasman M.D.   On: 06/22/2024 17:56   Result Date: 06/22/2024 CLINICAL DATA:  Cardiovascular Disease Risk stratification EXAM: Coronary Calcium Score TECHNIQUE: A gated, non-contrast computed tomography scan of the heart was performed using 2.5 mm slice thickness. Axial images were analyzed on a dedicated workstation. Calcium scoring of the coronary arteries was performed using the Agatston method. FINDINGS: Coronary Calcium Score: Left main: 0 Left anterior descending artery: 0 Left circumflex artery: 0 Right coronary artery: 0 Total: 0 Pericardium: Normal. Aorta: Normal caliber. Non-cardiac: See separate report from [facility]. IMPRESSION: 1. Coronary calcium score of 0. This is a low risk study. RECOMMENDATIONS: Coronary artery calcium (CAC) score is a strong predictor of incident coronary heart disease (CHD) and provides predictive information beyond traditional risk factors. CAC scoring is reasonable to use in the decision to withhold, postpone, or initiate statin therapy in intermediate-risk or selected borderline-risk asymptomatic adults (age 73-75 years and LDL-C >=70 to <190 mg/dL)  who do not have diabetes or established atherosclerotic cardiovascular disease (ASCVD).* In intermediate-risk (10-year ASCVD risk >=7.5% to <20%) adults or selected borderline-risk (10-year ASCVD risk >=5% to <7.5%) adults in whom a CAC score is measured for the purpose of making a treatment decision the following recommendations have been made: If CAC=0, it is reasonable to withhold statin therapy and reassess in 5 to 10 years, as long as higher risk conditions are absent (diabetes mellitus, family history of premature CHD in first degree relatives (males <55 years; females <65 years), cigarette smoking, or LDL >=190 mg/dL). If CAC is 1 to 99, it is reasonable to initiate statin therapy for patients >=34 years of age. If CAC is >=100 or >=75th percentile, it is reasonable to initiate statin therapy at any age. Cardiology referral should be considered for patients with CAC scores >=400 or >=75th percentile. *2018 AHA/ACC/AACVPR/AAPA/ABC/ACPM/ADA/AGS/APhA/ASPC/NLA/PCNA Guideline on the Management of Blood Cholesterol: A Report of the American College of Cardiology/American Heart Association Task Force on Clinical Practice Guidelines. J Am Coll Cardiol. 2019;73(24):3168-3209. Electronically Signed: By: Vinie JAYSON Maxcy M.D. On: 06/21/2024 14:39    Recent Labs: Lab Results  Component Value Date   WBC 4.7 06/07/2019   HGB 13.6 06/07/2019   PLT 216 06/07/2019   NA 136 08/22/2022   K 3.4 (L) 08/22/2022   CL 98  08/22/2022   CO2 30 08/22/2022   GLUCOSE 102 (H) 08/22/2022   BUN 10 08/22/2022   CREATININE 0.75 08/22/2022   BILITOT 0.6 06/07/2019   ALKPHOS 80 06/07/2019   AST 11 06/07/2019   ALT 6 06/07/2019   PROT 6.9 06/07/2019   ALBUMIN 4.4 06/07/2019   CALCIUM 9.1 08/22/2022   GFRAA 120 06/07/2019   Dec 18, 2023 TSH normal, CMP normal, vitamin D 34, CBC normal, hemoglobin A1c 5.2, free T4 normal, sed rate 2, RF negative, ANA 1: 320 NH positive, ENA (dsDNA, chromatin, Smith, RNP, SSA, SSB, SCL 70, Jo  1, centromere) negative, anticardiolipin negative, beta-2 GP 1 negative, C3-C4 normal, RF negative, anti-CCP negative, MCV negative, TPO antibody negative, Speciality Comments: No specialty comments available.  Procedures:  No procedures performed Allergies: Patient has no known allergies.   Assessment / Plan:     Visit Diagnoses: Primary hypertension  Congenital hypothyroidism without goiter  Anxiety  Orders: No orders of the defined types were placed in this encounter.  No orders of the defined types were placed in this encounter.   Face-to-face time spent with patient was *** minutes. Greater than 50% of time was spent in counseling and coordination of care.  Follow-Up Instructions: No follow-ups on file.   Maya Nash, MD  Note - This record has been created using Animal nutritionist.  Chart creation errors have been sought, but may not always  have been located. Such creation errors do not reflect on  the standard of medical care.

## 2024-07-08 ENCOUNTER — Other Ambulatory Visit: Payer: Self-pay | Admitting: Adult Health

## 2024-07-16 ENCOUNTER — Ambulatory Visit: Attending: Rheumatology | Admitting: Rheumatology

## 2024-07-16 ENCOUNTER — Ambulatory Visit

## 2024-07-16 ENCOUNTER — Encounter: Payer: Self-pay | Admitting: Rheumatology

## 2024-07-16 ENCOUNTER — Ambulatory Visit
Admission: RE | Admit: 2024-07-16 | Discharge: 2024-07-16 | Disposition: A | Source: Ambulatory Visit | Attending: Nurse Practitioner | Admitting: Nurse Practitioner

## 2024-07-16 VITALS — BP 136/94 | HR 75 | Temp 97.9°F | Resp 15 | Ht 65.0 in | Wt 168.6 lb

## 2024-07-16 DIAGNOSIS — F17200 Nicotine dependence, unspecified, uncomplicated: Secondary | ICD-10-CM

## 2024-07-16 DIAGNOSIS — M79672 Pain in left foot: Secondary | ICD-10-CM | POA: Diagnosis not present

## 2024-07-16 DIAGNOSIS — F419 Anxiety disorder, unspecified: Secondary | ICD-10-CM

## 2024-07-16 DIAGNOSIS — M25551 Pain in right hip: Secondary | ICD-10-CM

## 2024-07-16 DIAGNOSIS — G8929 Other chronic pain: Secondary | ICD-10-CM

## 2024-07-16 DIAGNOSIS — Z9889 Other specified postprocedural states: Secondary | ICD-10-CM

## 2024-07-16 DIAGNOSIS — M25561 Pain in right knee: Secondary | ICD-10-CM | POA: Diagnosis not present

## 2024-07-16 DIAGNOSIS — F1090 Alcohol use, unspecified, uncomplicated: Secondary | ICD-10-CM | POA: Diagnosis not present

## 2024-07-16 DIAGNOSIS — R7689 Other specified abnormal immunological findings in serum: Secondary | ICD-10-CM

## 2024-07-16 DIAGNOSIS — E031 Congenital hypothyroidism without goiter: Secondary | ICD-10-CM

## 2024-07-16 DIAGNOSIS — M19041 Primary osteoarthritis, right hand: Secondary | ICD-10-CM | POA: Diagnosis not present

## 2024-07-16 DIAGNOSIS — M25562 Pain in left knee: Secondary | ICD-10-CM

## 2024-07-16 DIAGNOSIS — G5602 Carpal tunnel syndrome, left upper limb: Secondary | ICD-10-CM

## 2024-07-16 DIAGNOSIS — M25552 Pain in left hip: Secondary | ICD-10-CM | POA: Diagnosis not present

## 2024-07-16 DIAGNOSIS — M19042 Primary osteoarthritis, left hand: Secondary | ICD-10-CM

## 2024-07-16 DIAGNOSIS — Z1231 Encounter for screening mammogram for malignant neoplasm of breast: Secondary | ICD-10-CM

## 2024-07-16 DIAGNOSIS — I10 Essential (primary) hypertension: Secondary | ICD-10-CM

## 2024-07-16 NOTE — Patient Instructions (Signed)
 Hip Exercises Ask your health care provider which exercises are safe for you. Do exercises exactly as told by your provider and adjust them as told. It is normal to feel mild stretching, pulling, tightness, or discomfort as you do these exercises. Stop right away if you feel sudden pain or your pain gets worse. Do not begin these exercises until told by your provider. Stretching and range-of-motion exercises These exercises warm up your muscles and joints and improve the movement and flexibility of your hip. They also help to relieve pain, numbness, and tingling. You may be asked to limit your range of motion if you had a hip replacement. Talk to your provider about these limits. Hamstrings, supine  Lie on your back (supine position). Loop a belt, towel, or exercise band over the ball of your left / right foot. The ball of your foot is on the walking surface, right under your toes. Straighten your left / right knee and slowly pull on the belt, towel, or band to raise your leg until you feel a gentle stretch behind your knee (hamstring). Do not let your knee bend while you do this. Keep your other leg flat on the floor. Hold this position for __________ seconds. Slowly return your leg to the starting position. Repeat __________ times. Complete this exercise __________ times a day. Hip rotation  Lie on your back on a firm surface. With your left / right hand, gently pull your left / right knee toward the shoulder that is on the same side of the body. Stop when your knee is pointing toward the ceiling. Hold your left / right ankle with your other hand. Keeping your knee steady, gently pull your left / right ankle toward your other shoulder until you feel a stretch in your butt. Keep your hips and shoulders firmly planted while you do this stretch. Hold this position for __________ seconds. Repeat __________ times. Complete this exercise __________ times a day. Seated stretch This exercise is  sometimes called hamstrings and adductors stretch. Sit on the floor with your legs stretched wide. Keep your knees straight during this exercise. Keeping your head and back in a straight line, bend at your waist to reach for your left foot (position A). You should feel a stretch in your right inner thigh (adductors). Hold this position for __________ seconds. Then slowly return to the upright position. Keeping your head and back in a straight line, bend at your waist to reach forward (position B). You should feel a stretch behind both of your thighs and knees (hamstrings). Hold this position for __________ seconds. Then slowly return to the upright position. Keeping your head and back in a straight line, bend at your waist to reach for your right foot (position C). You should feel a stretch in your left inner thigh (adductors). Hold this position for __________ seconds. Then slowly return to the upright position. Repeat __________ times. Complete this exercise __________ times a day. Lunge This exercise stretches the muscles of the hip (hip flexors). Place your left / right knee on the floor and bend your other knee so that is directly over your ankle. You should be half-kneeling. Keep good posture with your head over your shoulders. Tighten your butt muscles to point your tailbone downward. This will prevent your back from arching too much. You should feel a gentle stretch in the front of your left / right thigh and hip. If you do not feel a stretch, slide your other foot forward slightly and  then slowly lunge forward with your chest up until your knee once again lines up over your ankle. Make sure your tailbone continues to point downward. Hold this position for __________ seconds. Slowly return to the starting position. Repeat __________ times. Complete this exercise __________ times a day. Strengthening exercises These exercises build strength and endurance in your hip. Endurance is the  ability to use your muscles for a long time, even after they get tired. Bridge This exercise strengthens the muscles of your hip (hip extensors). Lie on your back on a firm surface with your knees bent and your feet flat on the floor. Tighten your butt muscles and lift your bottom off the floor until the trunk of your body and your hips are level with your thighs. Do not arch your back. You should feel the muscles working in your butt and the back of your thighs. If you do not feel these muscles, slide your feet 1-2 inches (2.5-5 cm) farther away from your butt. Hold this position for __________ seconds. Slowly lower your hips to the starting position. Let your muscles relax completely between repetitions. Repeat __________ times. Complete this exercise __________ times a day. Straight leg raises, side-lying This exercise strengthens the muscles that move the hip joint away from the center of the body (hip abductors). Lie on your side with your left / right leg in the top position. Lie so your head, shoulder, hip, and knee line up. You may bend your bottom knee slightly to help you balance. Roll your hips slightly forward, so your hips are stacked directly over each other and your left / right knee is facing forward. Leading with your heel, lift your top leg 4-6 inches (10-15 cm). You should feel the muscles in your top hip lifting. Do not let your foot drift forward. Do not let your knee roll toward the ceiling. Hold this position for __________ seconds. Slowly return to the starting position. Let your muscles relax completely between repetitions. Repeat __________ times. Complete this exercise __________ times a day. Straight leg raises, side-lying This exercise strengthens the muscles that move the hip joint toward the center of the body (hip adductors). Lie on your side with your left / right leg in the bottom position. Lie so your head, shoulder, hip, and knee line up. You may place your  upper foot in front to help you balance. Roll your hips slightly forward, so your hips are stacked directly over each other and your left / right knee is facing forward. Tense the muscles in your inner thigh and lift your bottom leg 4-6 inches (10-15 cm). Hold this position for __________ seconds. Slowly return to the starting position. Let your muscles relax completely between repetitions. Repeat __________ times. Complete this exercise __________ times a day. Straight leg raises, supine This exercise strengthens the muscles in the front of your thigh (quadriceps and hip flexors). Lie on your back (supine position) with your left / right leg extended and your other knee bent. Tense the muscles in the front of your left / right thigh. You should see your kneecap slide up or see increased dimpling just above your knee. Keep these muscles tight as you raise your leg 4-6 inches (10-15 cm) off the floor. Do not let your knee bend. Hold this position for __________ seconds. Keep these muscles tense as you lower your leg. Relax the muscles slowly and completely between repetitions. Repeat __________ times. Complete this exercise __________ times a day. Hip abductors, standing This  exercise strengthens the muscles that move the leg and hip joint away from the center of the body (hip abductors). Tie one end of a rubber exercise band or tubing to a secure surface, such as a chair, table, or pole. Loop the other end of the band or tubing around your left / right ankle. Keeping your ankle with the band or tubing directly opposite the secured end, step away until there is tension in the tubing or band. Hold on to a chair, table, or pole as needed for balance. Lift your left / right leg out to your side. While you do this: Keep your back upright. Keep your shoulders over your hips. Keep your toes pointing forward. Make sure to use your hip muscles to slowly lift your leg. Do not tip your body or  forcefully lift your leg. Hold this position for __________ seconds. Slowly return to the starting position. Repeat __________ times. Complete this exercise __________ times a day. Squats This exercise strengthens the muscles in the front of your thigh (quadriceps). Stand in front of a table, or stand in a doorframe so your feet and knees are in line with the frame. You may place your hands on the table or frame for balance. Slowly bend your knees and lower your hips like you are going to sit in a chair. Keep your lower legs in a straight up-and-down position. Do not let your hips go lower than your knees. Do not bend your knees lower than told by your provider. If your hip pain increases, do not bend as low. Hold this position for ___________ seconds. Slowly push with your legs to return to standing. Do not use your hands to pull yourself to standing. Repeat __________ times. Complete this exercise __________ times a day. This information is not intended to replace advice given to you by your health care provider. Make sure you discuss any questions you have with your health care provider. Document Revised: 03/26/2022 Document Reviewed: 03/26/2022 [employer] Patient Education  2024 [employer]Exercises for Chronic Knee Pain Chronic knee pain is pain that lasts longer than 3 months. For most people with chronic knee pain, exercise and weight loss is an important part of treatment. Your health care provider may want you to focus on: Making the muscles that support your knee stronger. This can take pressure off your knee and reduce pain. Preventing knee stiffness. How far you can move your knee, keeping it there or making it farther. Losing weight (if this applies) to take pressure off your knee, lower your risk for injury, and make it easier for you to exercise. Your provider will help you make an exercise program that fits your needs and physical abilities. Below are simple, low-impact  exercises you can do at home. Ask your provider or physical therapist how often you should do your exercise program and how many times to repeat each exercise. General safety tips  Get your provider's approval before doing any exercises. Start slowly and stop any time you feel pain. Do not exercise if your knee pain is flaring up. Warm up first. Stretching a cold muscle can cause an injury. Do 5-10 minutes of easy movement or light stretching before beginning your exercises. Do 5-10 minutes of low-impact activity (like walking or cycling) before starting strengthening exercises. Contact your provider any time you have pain during or after exercising. Exercise can cause discomfort but should not be painful. It is normal to be a little stiff or sore after exercising. Stretching  and range-of-motion exercises Front thigh stretch  Stand up straight and support your body by holding on to a chair or resting one hand on a wall. With your legs straight and close together, bend one knee to lift your heel up toward your butt. Using one hand for support, grab your ankle with your free hand. Pull your foot up closer toward your butt to feel the stretch in front of your thigh. Hold the stretch for 30 seconds. Repeat __________ times. Complete this exercise __________ times a day. Back thigh stretch  Sit on the floor with your back straight and your legs out straight in front of you. Place the palms of your hands on the floor and slide them toward your feet as you bend at the hip. Try to touch your nose to your knees and feel the stretch in the back of your thighs. Hold for 30 seconds. Repeat __________ times. Complete this exercise __________ times a day. Calf stretch  Stand facing a wall. Place the palms of your hands flat against the wall, arms extended, and lean slightly against the wall. Get into a lunge position with one leg bent at the knee and the other leg stretched out straight behind  you. Keep both feet facing the wall and increase the bend in your knee while keeping the heel of the other leg flat on the ground. You should feel the stretch in your calf. Hold for 30 seconds. Repeat __________ times. Complete this exercise __________ times a day. Strengthening exercises Straight leg lift  Lie on your back with one knee bent and the other leg out straight. Slowly lift the straight leg without bending the knee. Lift until your foot is about 12 inches (30 cm) off the floor. Hold for 3-5 seconds and slowly lower your leg. Repeat __________ times. Complete this exercise __________ times a day. Single leg dip  Stand between two chairs and put both hands on the backs of the chairs for support. Extend one leg out straight with your body weight resting on the heel of the standing leg. Slowly bend your standing knee to dip your body to the level that is comfortable for you. Hold for 3-5 seconds. Repeat __________ times. Complete this exercise __________ times a day. Hamstring curls  Stand straight, knees close together, facing the back of a chair. Hold on to the back of a chair with both hands. Keep one leg straight. Bend the other knee while bringing the heel up toward the butt until the knee is bent at a 90-degree angle (right angle). Hold for 3-5 seconds. Repeat __________ times. Complete this exercise __________ times a day. Wall squat  Stand straight with your back, hips, and head against a wall. Step forward one foot at a time with your back still against the wall. Your feet should be 2 feet (61 cm) from the wall at shoulder width. Keeping your back, hips, and head against the wall, slide down the wall to as close to a sitting position as you can get. Hold for 5-10 seconds, then slowly slide back up. Repeat __________ times. Complete this exercise __________ times a day. Step-ups  Stand in front of a sturdy platform or stool that is about 6 inches (15 cm)  high. Slowly step up with your left / right foot, keeping your knee in line with your hip and foot. Do not let your knee bend so far that you cannot see your toes. Hold on to a chair for balance, but do  not use it for support. Slowly unlock your knee and lower yourself to the starting position. Repeat __________ times. Complete this exercise __________ times a day. Contact a health care provider if: Your exercises cause pain. Your pain is worse after you exercise. Your pain prevents you from doing your exercises. This information is not intended to replace advice given to you by your health care provider. Make sure you discuss any questions you have with your health care provider. Document Revised: 08/06/2022 Document Reviewed: 08/06/2022 Else[employer] Patient Education  2024 [employer]Osteoarthritis  Osteoarthritis is a type of arthritis. It refers to joint pain or joint disease. Osteoarthritis affects tissue that covers the ends of bones in joints (cartilage). Cartilage acts as a cushion between the bones and helps them move smoothly. Osteoarthritis occurs when cartilage in the joints gets worn down. Osteoarthritis is sometimes called wear and tear arthritis. Osteoarthritis is the most common form of arthritis. It often occurs in older people. It is a condition that gets worse over time. The joints most often affected by this condition are in the fingers, toes, hips, knees, and spine, including the neck and lower back. What are the causes? This condition is caused by the wearing down of cartilage that covers the ends of bones. What increases the risk? The following factors may make you more likely to develop this condition: Being age 35 or older. Obesity. Overuse of joints. Past injury of a joint. Past surgery on a joint. Family history of osteoarthritis. What are the signs or symptoms? The main symptoms of this condition are pain, swelling, and stiffness in the joint. Other symptoms may  include: An enlarged joint. More pain and further damage caused by small pieces of bone or cartilage that break off and float inside of the joint. Small deposits of bone (osteophytes) that grow on the edges of the joint. A grating or scraping feeling inside the joint when you move it. Popping or creaking sounds when you move. Difficulty walking or exercising. An inability to grip items, twist your hand, or control the movements of your hands and fingers. How is this diagnosed? This condition may be diagnosed based on: Your medical history. A physical exam. Your symptoms. X-rays of the affected joints. Blood tests to rule out other types of arthritis. How is this treated? There is no cure for this condition, but treatment can help control pain and improve joint function. Treatment may include a combination of therapies, such as: Pain relief techniques, such as: Applying heat and cold to the joint. Massage. A form of talk therapy called cognitive behavioral therapy (CBT). This therapy helps you set goals and follow up on the changes that you make. Medicines for pain and inflammation. The medicines can be taken by mouth or applied to the skin. They include: NSAIDs, such as ibuprofen. Prescription medicines. Strong anti-inflammatory medicines (corticosteroids). Certain nutritional supplements. A prescribed exercise program. You may work with a physical therapist. Assistive devices, such as a brace, wrap, splint, specialized glove, or cane. A weight control plan. Surgery, such as: An osteotomy. This is done to reposition the bones and relieve pain or to remove loose pieces of bone and cartilage. Joint replacement surgery. You may need this surgery if you have advanced osteoarthritis. Follow these instructions at home: Activity Rest your affected joints as told by your health care provider. Exercise as told by your provider. The provider may recommend specific types of exercise, such  as: Strengthening exercises. These are done to strengthen the muscles that  support joints affected by arthritis. Aerobic activities. These are exercises, such as brisk walking or water  aerobics, that increase your heart rate. Range-of-motion activities. These help your joints move more easily. Balance and agility exercises. Managing pain, stiffness, and swelling     If told, apply heat to the affected area as often as told by your provider. Use the heat source that your provider recommends, such as a moist heat pack or a heating pad. If you have a removable assistive device, remove it as told by your provider. Place a towel between your skin and the heat source. If your provider tells you to keep the assistive device on while you apply heat, place a towel between the assistive device and the heat source. Leave the heat on for 20-30 minutes. If told, put ice on the affected area. If you have a removable assistive device, remove it as told by your provider. Put ice in a plastic bag. Place a towel between your skin and the bag. If your provider tells you to keep the assistive device on during icing, place a towel between the assistive device and the bag. Leave the ice on for 20 minutes, 2-3 times a day. If your skin turns bright red, remove the ice or heat right away to prevent skin damage. The risk of damage is higher if you cannot feel pain, heat, or cold. Move your fingers or toes often to reduce stiffness and swelling. Raise (elevate) the affected area above the level of your heart while you are sitting or lying down. General instructions Take over-the-counter and prescription medicines only as told by your provider. Maintain a healthy weight. Follow instructions from your provider for weight control. Do not use any products that contain nicotine or tobacco. These products include cigarettes, chewing tobacco, and vaping devices, such as e-cigarettes. If you need help quitting, ask your  provider. Use assistive devices as told by your provider. Where to find more information [employer] and [employer]culoskeletal [employer] Diseases: niams.http://www.myers.net/ [employer] on Aging: baseringtones.pl American [employer] of Rheumatology: rheumatology.org Contact a health care provider if: You have redness, swelling, or a feeling of warmth in a joint that gets worse. You have a fever along with joint or muscle aches. You develop a rash. You have trouble doing your normal activities. You have pain that gets worse and is not relieved by pain medicine. This information is not intended to replace advice given to you by your health care provider. Make sure you discuss any questions you have with your health care provider. Document Revised: 03/21/2022 Document Reviewed: 03/21/2022 Elsevier Patient Education  2024 [employer].

## 2024-07-23 ENCOUNTER — Other Ambulatory Visit: Payer: Self-pay | Admitting: Nurse Practitioner

## 2024-07-23 DIAGNOSIS — R928 Other abnormal and inconclusive findings on diagnostic imaging of breast: Secondary | ICD-10-CM

## 2024-08-10 ENCOUNTER — Ambulatory Visit
Admission: RE | Admit: 2024-08-10 | Discharge: 2024-08-10 | Disposition: A | Source: Ambulatory Visit | Attending: Nurse Practitioner | Admitting: Nurse Practitioner

## 2024-08-10 DIAGNOSIS — R928 Other abnormal and inconclusive findings on diagnostic imaging of breast: Secondary | ICD-10-CM

## 2024-08-12 ENCOUNTER — Ambulatory Visit: Admitting: Adult Health

## 2024-08-17 ENCOUNTER — Ambulatory Visit: Admitting: Rheumatology

## 2024-09-04 ENCOUNTER — Other Ambulatory Visit: Payer: Self-pay | Admitting: Adult Health

## 2024-10-11 ENCOUNTER — Ambulatory Visit: Admitting: Adult Health
